# Patient Record
Sex: Female | Born: 1990 | Race: Black or African American | Hispanic: No | Marital: Single | Smoking: Current every day smoker
Health system: Southern US, Community
[De-identification: ages and names within clinical notes are randomized; demographics above are authoritative.]

## PROBLEM LIST (undated history)

## (undated) DIAGNOSIS — Z789 Other specified health status: Secondary | ICD-10-CM

## (undated) HISTORY — DX: Other specified health status: Z78.9

## (undated) HISTORY — PX: TONSILLECTOMY: SUR1361

---
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
## 2011-02-07 ENCOUNTER — Emergency Department: Payer: Self-pay | Admitting: *Deleted

## 2011-07-31 ENCOUNTER — Emergency Department: Payer: Self-pay | Admitting: Emergency Medicine

## 2011-07-31 LAB — PREGNANCY, URINE: Pregnancy Test, Urine: NEGATIVE m[IU]/mL

## 2011-07-31 LAB — URINALYSIS, COMPLETE
Bilirubin,UR: NEGATIVE
Ketone: NEGATIVE
Ph: 6 (ref 4.5–8.0)
Protein: NEGATIVE
RBC,UR: <1 /HPF (ref 0–5)
Squamous Epithelial: 10

## 2011-07-31 LAB — COMPREHENSIVE METABOLIC PANEL
Albumin: 3.8 g/dL (ref 3.4–5.0)
Alkaline Phosphatase: 86 U/L (ref 50–136)
Anion Gap: 7 (ref 7–16)
BUN: 9 mg/dL (ref 7–18)
Bilirubin,Total: 0.3 mg/dL (ref 0.2–1.0)
Calcium, Total: 9 mg/dL (ref 8.5–10.1)
Chloride: 107 mmol/L (ref 98–107)
EGFR (African American): >60
EGFR (Non-African Amer.): >60
Osmolality: 278 (ref 275–301)
Potassium: 4.5 mmol/L (ref 3.5–5.1)
Total Protein: 7.7 g/dL (ref 6.4–8.2)

## 2011-07-31 LAB — CBC
HCT: 41.3 % (ref 35.0–47.0)
MCHC: 33.2 g/dL (ref 32.0–36.0)
MCV: 95 fL (ref 80–100)
RBC: 4.34 10*6/uL (ref 3.80–5.20)
RDW: 13.8 % (ref 11.5–14.5)
WBC: 4.1 10*3/uL (ref 3.6–11.0)

## 2011-08-01 LAB — URINE CULTURE

## 2011-12-24 ENCOUNTER — Ambulatory Visit: Payer: Self-pay | Admitting: Family Medicine

## 2012-01-23 ENCOUNTER — Encounter: Payer: Self-pay | Admitting: Orthopedic Surgery

## 2012-01-31 ENCOUNTER — Encounter: Payer: Self-pay | Admitting: Orthopedic Surgery

## 2012-03-01 ENCOUNTER — Encounter: Payer: Self-pay | Admitting: Orthopedic Surgery

## 2012-04-01 ENCOUNTER — Encounter: Payer: Self-pay | Admitting: Orthopedic Surgery

## 2013-03-23 ENCOUNTER — Emergency Department: Payer: Self-pay | Admitting: Internal Medicine

## 2013-03-23 LAB — URINALYSIS, COMPLETE
Bilirubin,UR: NEGATIVE
Hyaline Cast: 2
Nitrite: POSITIVE
Ph: 7 (ref 4.5–8.0)
Protein: NEGATIVE
RBC,UR: 1 /HPF (ref 0–5)
Squamous Epithelial: 3
WBC UR: 3 /HPF (ref 0–5)

## 2013-03-25 LAB — URINE CULTURE

## 2013-06-04 ENCOUNTER — Emergency Department: Payer: Self-pay | Admitting: Emergency Medicine

## 2013-06-04 LAB — PREGNANCY, URINE: PREGNANCY TEST, URINE: NEGATIVE m[IU]/mL

## 2013-06-04 LAB — COMPREHENSIVE METABOLIC PANEL
ALBUMIN: 3.8 g/dL (ref 3.4–5.0)
ALT: 28 U/L (ref 12–78)
Alkaline Phosphatase: 73 U/L
Anion Gap: 3 — ABNORMAL LOW (ref 7–16)
BILIRUBIN TOTAL: 0.4 mg/dL (ref 0.2–1.0)
BUN: 12 mg/dL (ref 7–18)
CALCIUM: 8.6 mg/dL (ref 8.5–10.1)
CREATININE: 0.68 mg/dL (ref 0.60–1.30)
Chloride: 109 mmol/L — ABNORMAL HIGH (ref 98–107)
Co2: 28 mmol/L (ref 21–32)
EGFR (Non-African Amer.): >60
GLUCOSE: 108 mg/dL — AB (ref 65–99)
OSMOLALITY: 280 (ref 275–301)
Potassium: 3.9 mmol/L (ref 3.5–5.1)
SGOT(AST): 25 U/L (ref 15–37)
Sodium: 140 mmol/L (ref 136–145)
Total Protein: 6.9 g/dL (ref 6.4–8.2)

## 2013-06-04 LAB — URINALYSIS, COMPLETE
Bilirubin,UR: NEGATIVE
Blood: NEGATIVE
GLUCOSE, UR: NEGATIVE mg/dL (ref 0–75)
Ketone: NEGATIVE
NITRITE: NEGATIVE
Ph: 6 (ref 4.5–8.0)
Specific Gravity: 1.024 (ref 1.003–1.030)
Squamous Epithelial: 10

## 2013-06-04 LAB — CBC WITH DIFFERENTIAL/PLATELET
BASOS ABS: 0 10*3/uL (ref 0.0–0.1)
BASOS PCT: 0.4 %
Eosinophil #: 0 10*3/uL (ref 0.0–0.7)
Eosinophil %: 0.1 %
HCT: 40.9 % (ref 35.0–47.0)
HGB: 13.8 g/dL (ref 12.0–16.0)
LYMPHS PCT: 17.9 %
Lymphocyte #: 1.1 10*3/uL (ref 1.0–3.6)
MCH: 33.5 pg (ref 26.0–34.0)
MCHC: 33.7 g/dL (ref 32.0–36.0)
MCV: 100 fL (ref 80–100)
MONOS PCT: 5.9 %
Monocyte #: 0.4 x10 3/mm (ref 0.2–0.9)
NEUTROS ABS: 4.6 10*3/uL (ref 1.4–6.5)
Neutrophil %: 75.7 %
Platelet: 123 10*3/uL — ABNORMAL LOW (ref 150–440)
RBC: 4.11 10*6/uL (ref 3.80–5.20)
RDW: 13.7 % (ref 11.5–14.5)
WBC: 6.1 10*3/uL (ref 3.6–11.0)

## 2013-06-04 LAB — LIPASE, BLOOD: Lipase: 141 U/L (ref 73–393)

## 2014-10-22 ENCOUNTER — Emergency Department: Payer: Self-pay

## 2014-10-22 ENCOUNTER — Emergency Department
Admission: EM | Admit: 2014-10-22 | Discharge: 2014-10-22 | Disposition: A | Discharge status: Home / Self Care | Payer: Self-pay | Attending: Emergency Medicine | Admitting: Emergency Medicine

## 2014-10-22 DIAGNOSIS — K2970 Gastritis, unspecified, without bleeding: Secondary | ICD-10-CM | POA: Insufficient documentation

## 2014-10-22 DIAGNOSIS — Z3202 Encounter for pregnancy test, result negative: Secondary | ICD-10-CM | POA: Insufficient documentation

## 2014-10-22 DIAGNOSIS — R110 Nausea: Secondary | ICD-10-CM

## 2014-10-22 DIAGNOSIS — Z79899 Other long term (current) drug therapy: Secondary | ICD-10-CM | POA: Insufficient documentation

## 2014-10-22 DIAGNOSIS — R52 Pain, unspecified: Secondary | ICD-10-CM

## 2014-10-22 LAB — URINALYSIS COMPLETE WITH MICROSCOPIC (ARMC ONLY)
BACTERIA UA: NONE SEEN
BILIRUBIN URINE: NEGATIVE
Glucose, UA: NEGATIVE mg/dL
Hgb urine dipstick: NEGATIVE
KETONES UR: NEGATIVE mg/dL
Nitrite: NEGATIVE
Protein, ur: 100 mg/dL — AB
RBC / HPF: NONE SEEN RBC/hpf (ref 0–5)
SPECIFIC GRAVITY, URINE: 1.026 (ref 1.005–1.030)
Squamous Epithelial / LPF: NONE SEEN
WBC UA: NONE SEEN WBC/hpf (ref 0–5)
pH: 6 (ref 5.0–8.0)

## 2014-10-22 LAB — COMPREHENSIVE METABOLIC PANEL
ALK PHOS: 59 U/L (ref 38–126)
ALT: 19 U/L (ref 14–54)
ANION GAP: 6 (ref 5–15)
AST: 19 U/L (ref 15–41)
Albumin: 4.2 g/dL (ref 3.5–5.0)
BILIRUBIN TOTAL: 0.4 mg/dL (ref 0.3–1.2)
BUN: 13 mg/dL (ref 6–20)
CO2: 25 mmol/L (ref 22–32)
CREATININE: 0.81 mg/dL (ref 0.44–1.00)
Calcium: 9.1 mg/dL (ref 8.9–10.3)
Chloride: 108 mmol/L (ref 101–111)
GFR calc Af Amer: >60 mL/min (ref >60)
GFR calc non Af Amer: >60 mL/min (ref >60)
GLUCOSE: 92 mg/dL (ref 65–99)
POTASSIUM: 3.7 mmol/L (ref 3.5–5.1)
Sodium: 139 mmol/L (ref 135–145)
Total Protein: 7.1 g/dL (ref 6.5–8.1)

## 2014-10-22 LAB — CBC
HCT: 43.3 % (ref 35.0–47.0)
HEMOGLOBIN: 14.5 g/dL (ref 12.0–16.0)
MCH: 32.8 pg (ref 26.0–34.0)
MCHC: 33.4 g/dL (ref 32.0–36.0)
MCV: 98.4 fL (ref 80.0–100.0)
PLATELETS: 144 10*3/uL — AB (ref 150–440)
RBC: 4.41 MIL/uL (ref 3.80–5.20)
RDW: 13.1 % (ref 11.5–14.5)
WBC: 4.8 10*3/uL (ref 3.6–11.0)

## 2014-10-22 LAB — POCT PREGNANCY, URINE: Preg Test, Ur: NEGATIVE

## 2014-10-22 LAB — LIPASE, BLOOD: Lipase: 42 U/L (ref 22–51)

## 2014-10-22 MED ORDER — METOCLOPRAMIDE HCL 10 MG PO TABS
10.0000 mg | ORAL_TABLET | Freq: Once | ORAL | Status: AC
  Administered 2014-10-22: 10 mg via ORAL
  Filled 2014-10-22 (×2): qty 1

## 2014-10-22 MED ORDER — SUCRALFATE 1 G PO TABS: 1.0000 g | ORAL_TABLET | Freq: Four times a day (QID) | ORAL | Status: DC

## 2014-10-22 MED ORDER — ONDANSETRON 4 MG PO TBDP
4.0000 mg | ORAL_TABLET | Freq: Once | ORAL | Status: AC | PRN
  Administered 2014-10-22: 4 mg via ORAL

## 2014-10-22 MED ORDER — METOCLOPRAMIDE HCL 10 MG PO TABS: 10.0000 mg | ORAL_TABLET | Freq: Three times a day (TID) | ORAL | Status: DC

## 2014-10-22 NOTE — ED Notes (Signed)
Pt to ED c/o persistent nausea x 1 month.

## 2014-10-22 NOTE — ED Notes (Signed)
Patient with complaint of nausea and upper abd pressure times one month.

## 2014-10-22 NOTE — ED Provider Notes (Signed)
[facility] Emergency Department Provider Note  ____________________________________________  Time seen: Approximately 632 AM  I have reviewed the triage vital signs and the nursing notes.   HISTORY  Chief Complaint Nausea    HPI Loretta Warner is a 24 y.o. female who has had nausea for one month. The patient reports that she's had one episode of emesis a few days ago and one black stool 2 days ago. The patient reports that she took Pepto-Bismol approximately 3 days ago for the nauseous feeling. The patient reports to also having occasional abdominal pain and pressure. She reports that her last bowel movement was approximately 2 days ago. She does not regularly have bowel movements. She reports that the pain is worse when his pressure on itbut does not describe that she has any pain at this time. The patient reports that she's just unsure why she continually feels sick. She reports that it doesn't matter if she eats or does not eat she feels nauseous all of the time.   No past medical history on file.  There are no active problems to display for this patient.   Past surgical history Tonsillectomy  Current Outpatient Rx  Name  Route  Sig  Dispense  Refill  . Multiple Vitamin (MULTIVITAMIN) capsule   Oral   Take 1 capsule by mouth daily.         . metoCLOPramide (REGLAN) 10 MG tablet   Oral   Take 1 tablet (10 mg total) by mouth 3 (three) times daily with meals.   21 tablet   0   . sucralfate (CARAFATE) 1 G tablet   Oral   Take 1 tablet (1 g total) by mouth 4 (four) times daily.   30 tablet   0     Allergies Macadamia nut oil  No family history on file.  Social History History  Substance Use Topics  . Smoking status: Smoker  . Smokeless tobacco: Not on file  . Alcohol Use: Not on file    Review of Systems Constitutional: No fever/chills Eyes: No visual changes. ENT: No sore throat. Cardiovascular: Denies chest  pain. Respiratory: Denies shortness of breath. Gastrointestinal: Abdominal pain, nausea, vomiting Genitourinary: Negative for dysuria. Musculoskeletal: Negative for back pain. Skin: Negative for rash. Neurological: Negative for headaches, focal weakness or numbness. 10-point ROS otherwise negative.  ____________________________________________   PHYSICAL EXAM:  VITAL SIGNS: ED Triage Vitals  Enc Vitals Group     BP 10/22/14 0040 121/78 mmHg     Pulse Rate 10/22/14 0040 86     Resp --      Temp 10/22/14 0040 98.4 F (36.9 C)     Temp Source 10/22/14 0040 Oral     SpO2 10/22/14 0040 100 %     Weight 10/22/14 0040 189 lb (85.73 kg)     Height 10/22/14 0040  (1.626 m)     Head Cir --      Peak Flow --      Pain Score 10/22/14 0044 0     Pain Loc --      Pain Edu? --      Excl. in GC? --     Constitutional: Alert and oriented. Well appearing and in no acute distress. Eyes: Conjunctivae are normal. PERRL. EOMI. Head: Atraumatic. Nose: No congestion/rhinnorhea. Mouth/Throat: Mucous membranes are moist.  Oropharynx non-erythematous. Cardiovascular: Normal rate, regular rhythm. Grossly normal heart sounds.  Good peripheral circulation. Respiratory: Normal respiratory effort.  No retractions. Lungs CTAB. Gastrointestinal: Soft,  epigastric and RUQ TTP. No distention. Positive bowel sounds  Genitourinary: deferred Rectal: Brown stool, heme negative Musculoskeletal: No lower extremity tenderness nor edema.   Neurologic:  Normal speech and language. No gross focal neurologic deficits are appreciated.  Skin:  Skin is warm, dry and intact.  Psychiatric: Mood and affect are normal.   ____________________________________________   LABS (all labs ordered are listed, but only abnormal results are displayed)  Labs Reviewed  CBC - Abnormal; Notable for the following:    Platelets 144 (*)    All other components within normal limits  URINALYSIS COMPLETEWITH MICROSCOPIC ([facility]  ONLY) - Abnormal; Notable for the following:    Color, Urine YELLOW (*)    APPearance HAZY (*)    Protein, ur 100 (*)    Leukocytes, UA TRACE (*)    All other components within normal limits  LIPASE, BLOOD  COMPREHENSIVE METABOLIC PANEL  POC URINE PREG, ED  POCT PREGNANCY, URINE   ____________________________________________  EKG  none ____________________________________________  RADIOLOGY  RUQ Ultrasound: Negative for gallstones, normal exam ____________________________________________   PROCEDURES  Procedure(s) performed: None  Critical Care performed: No  ____________________________________________   INITIAL IMPRESSION / ASSESSMENT AND PLAN / ED COURSE  Pertinent labs & imaging results that were available during my care of the patient were reviewed by me and considered in my medical decision making (see chart for details).  The patient is a rolled female who comes in today with nausea for one month and occasional abdominal pain and vomiting. The patient does have some tenderness to palpation of her abdomen so I will do an ultrasound to evaluate for possible gallstones. The patient will also receive some Reglan for her nausea. She'll be reassessed once received the results of the ultrasound.  The patient received ultrasound and it was unremarkable. At this time I feel the patient should follow-up with GI physician for further evaluation of her symptoms. ____________________________________________   FINAL CLINICAL IMPRESSION(S) / ED DIAGNOSES  Final diagnoses:  Nausea  Pain  Gastritis      Rebecka Apley, MD 10/22/14 [phone]

## 2014-10-22 NOTE — Discharge Instructions (Signed)
Nausea, Adult Nausea is the feeling that you have an upset stomach or have to vomit. Nausea by itself is not likely a serious concern, but it may be an early sign of more serious medical problems. As nausea gets worse, it can lead to vomiting. If vomiting develops, there is the risk of dehydration.  CAUSES   Viral infections.  Food poisoning.  Medicines.  Pregnancy.  Motion sickness.  Migraine headaches.  Emotional distress.  Severe pain from any source.  Alcohol intoxication. HOME CARE INSTRUCTIONS  Get plenty of rest.  Ask your caregiver about specific rehydration instructions.  Eat small amounts of food and sip liquids more often.  Take all medicines as told by your caregiver. SEEK MEDICAL CARE IF:  You have not improved after 2 days, or you get worse.  You have a headache. SEEK IMMEDIATE MEDICAL CARE IF:   You have a fever.  You faint.  You keep vomiting or have blood in your vomit.  You are extremely weak or dehydrated.  You have dark or bloody stools.  You have severe chest or abdominal pain. MAKE SURE YOU:  Understand these instructions.  Will watch your condition.  Will get help right away if you are not doing well or get worse. Document Released: 04/25/2004 Document Revised: 12/11/2011 Document Reviewed: 11/28/2010 [facility] Patient Information 2015 [place], [place]. This information is not intended to replace advice given to you by your health care provider. Make sure you discuss any questions you have with your health care provider.  Gastritis, Adult Gastritis is soreness and swelling (inflammation) of the lining of the stomach. Gastritis can develop as a sudden onset (acute) or long-term (chronic) condition. If gastritis is not treated, it can lead to stomach bleeding and ulcers. CAUSES  Gastritis occurs when the stomach lining is weak or damaged. Digestive juices from the stomach then inflame the weakened stomach lining. The stomach lining may  be weak or damaged due to viral or bacterial infections. One common bacterial infection is the Helicobacter pylori infection. Gastritis can also result from excessive alcohol consumption, taking certain medicines, or having too much acid in the stomach.  SYMPTOMS  In some cases, there are no symptoms. When symptoms are present, they may include:  Pain or a burning sensation in the upper abdomen.  Nausea.  Vomiting.  An uncomfortable feeling of fullness after eating. DIAGNOSIS  Your caregiver may suspect you have gastritis based on your symptoms and a physical exam. To determine the cause of your gastritis, your caregiver may perform the following:  Blood or stool tests to check for the H pylori bacterium.  Gastroscopy. A thin, flexible tube (endoscope) is passed down the esophagus and into the stomach. The endoscope has a light and camera on the end. Your caregiver uses the endoscope to view the inside of the stomach.  Taking a tissue sample (biopsy) from the stomach to examine under a microscope. TREATMENT  Depending on the cause of your gastritis, medicines may be prescribed. If you have a bacterial infection, such as an H pylori infection, antibiotics may be given. If your gastritis is caused by too much acid in the stomach, H2 blockers or antacids may be given. Your caregiver may recommend that you stop taking aspirin, ibuprofen, or other nonsteroidal anti-inflammatory drugs (NSAIDs). HOME CARE INSTRUCTIONS  Only take over-the-counter or prescription medicines as directed by your caregiver.  If you were given antibiotic medicines, take them as directed. Finish them even if you start to feel better.  Drink  enough fluids to keep your urine clear or pale yellow.  Avoid foods and drinks that make your symptoms worse, such as:  Caffeine or alcoholic drinks.  Chocolate.  Peppermint or mint flavorings.  Garlic and onions.  Spicy foods.  Citrus fruits, such as oranges, lemons, or  limes.  Tomato-based foods such as sauce, chili, salsa, and pizza.  Fried and fatty foods.  Eat small, frequent meals instead of large meals. SEEK IMMEDIATE MEDICAL CARE IF:   You have black or dark red stools.  You vomit blood or material that looks like coffee grounds.  You are unable to keep fluids down.  Your abdominal pain gets worse.  You have a fever.  You do not feel better after 1 week.  You have any other questions or concerns. MAKE SURE YOU:  Understand these instructions.  Will watch your condition.  Will get help right away if you are not doing well or get worse. Document Released: 03/12/2001 Document Revised: 09/17/2011 Document Reviewed: 05/01/2011 [facility] Patient Information 2015 [place], [place]. This information is not intended to replace advice given to you by your health care provider. Make sure you discuss any questions you have with your health care provider.

## 2015-09-20 ENCOUNTER — Encounter: Payer: Self-pay | Admitting: Emergency Medicine

## 2015-09-20 ENCOUNTER — Emergency Department: Payer: No Typology Code available for payment source

## 2015-09-20 ENCOUNTER — Emergency Department
Admission: EM | Admit: 2015-09-20 | Discharge: 2015-09-20 | Disposition: A | Discharge status: Home / Self Care | Payer: No Typology Code available for payment source | Attending: Emergency Medicine | Admitting: Emergency Medicine

## 2015-09-20 DIAGNOSIS — Y999 Unspecified external cause status: Secondary | ICD-10-CM | POA: Insufficient documentation

## 2015-09-20 DIAGNOSIS — Y9389 Activity, other specified: Secondary | ICD-10-CM | POA: Diagnosis not present

## 2015-09-20 DIAGNOSIS — F17210 Nicotine dependence, cigarettes, uncomplicated: Secondary | ICD-10-CM | POA: Insufficient documentation

## 2015-09-20 DIAGNOSIS — Y92410 Unspecified street and highway as the place of occurrence of the external cause: Secondary | ICD-10-CM | POA: Diagnosis not present

## 2015-09-20 DIAGNOSIS — M25511 Pain in right shoulder: Secondary | ICD-10-CM | POA: Diagnosis present

## 2015-09-20 DIAGNOSIS — S134XXA Sprain of ligaments of cervical spine, initial encounter: Secondary | ICD-10-CM | POA: Diagnosis not present

## 2015-09-20 MED ORDER — CYCLOBENZAPRINE HCL 10 MG PO TABS
10.0000 mg | ORAL_TABLET | Freq: Once | ORAL | Status: AC
  Administered 2015-09-20: 10 mg via ORAL
  Filled 2015-09-20: qty 1

## 2015-09-20 MED ORDER — CYCLOBENZAPRINE HCL 10 MG PO TABS: 10.0000 mg | ORAL_TABLET | Freq: Three times a day (TID) | ORAL | Status: DC | PRN

## 2015-09-20 MED ORDER — LIDOCAINE VISCOUS 2 % MT SOLN
15.0000 mL | Freq: Once | OROMUCOSAL | Status: AC
  Administered 2015-09-20: 15 mL via OROMUCOSAL
  Filled 2015-09-20: qty 15

## 2015-09-20 NOTE — ED Notes (Signed)
Pt discharged to home.  Family member driving.  Discharge instructions reviewed.  Verbalized understanding.  No questions or concerns at this time.  Teach back verified.  Pt in NAD.  No items left in ED.   
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
## 2016-02-13 ENCOUNTER — Emergency Department: Payer: Self-pay

## 2016-02-13 ENCOUNTER — Emergency Department
Admission: EM | Admit: 2016-02-13 | Discharge: 2016-02-13 | Disposition: A | Discharge status: Home / Self Care | Payer: Self-pay | Attending: Emergency Medicine | Admitting: Emergency Medicine

## 2016-02-13 DIAGNOSIS — N83292 Other ovarian cyst, left side: Secondary | ICD-10-CM | POA: Insufficient documentation

## 2016-02-13 DIAGNOSIS — Z79899 Other long term (current) drug therapy: Secondary | ICD-10-CM | POA: Insufficient documentation

## 2016-02-13 DIAGNOSIS — F17210 Nicotine dependence, cigarettes, uncomplicated: Secondary | ICD-10-CM | POA: Insufficient documentation

## 2016-02-13 DIAGNOSIS — N83299 Other ovarian cyst, unspecified side: Secondary | ICD-10-CM

## 2016-02-13 DIAGNOSIS — R102 Pelvic and perineal pain: Secondary | ICD-10-CM

## 2016-02-13 LAB — WET PREP, GENITAL
Clue Cells Wet Prep HPF POC: NONE SEEN
Sperm: NONE SEEN
Trich, Wet Prep: NONE SEEN
Yeast Wet Prep HPF POC: NONE SEEN

## 2016-02-13 LAB — CHLAMYDIA/NGC RT PCR (ARMC ONLY)
CHLAMYDIA TR: NOT DETECTED
N GONORRHOEAE: NOT DETECTED

## 2016-02-13 LAB — COMPREHENSIVE METABOLIC PANEL
ALT: 12 U/L — ABNORMAL LOW (ref 14–54)
AST: 17 U/L (ref 15–41)
Albumin: 4.1 g/dL (ref 3.5–5.0)
Alkaline Phosphatase: 50 U/L (ref 38–126)
Anion gap: 5 (ref 5–15)
BILIRUBIN TOTAL: 0.7 mg/dL (ref 0.3–1.2)
BUN: 11 mg/dL (ref 6–20)
CO2: 28 mmol/L (ref 22–32)
Calcium: 9.3 mg/dL (ref 8.9–10.3)
Chloride: 107 mmol/L (ref 101–111)
Creatinine, Ser: 0.74 mg/dL (ref 0.44–1.00)
Glucose, Bld: 97 mg/dL (ref 65–99)
POTASSIUM: 4 mmol/L (ref 3.5–5.1)
Sodium: 140 mmol/L (ref 135–145)
TOTAL PROTEIN: 7 g/dL (ref 6.5–8.1)

## 2016-02-13 LAB — URINALYSIS COMPLETE WITH MICROSCOPIC (ARMC ONLY)
Bacteria, UA: NONE SEEN
Bilirubin Urine: NEGATIVE
GLUCOSE, UA: NEGATIVE mg/dL
Ketones, ur: NEGATIVE mg/dL
LEUKOCYTES UA: NEGATIVE
NITRITE: NEGATIVE
Protein, ur: 30 mg/dL — AB
SPECIFIC GRAVITY, URINE: 1.023 (ref 1.005–1.030)
pH: 6 (ref 5.0–8.0)

## 2016-02-13 LAB — CBC
HEMATOCRIT: 43.4 % (ref 35.0–47.0)
Hemoglobin: 14.8 g/dL (ref 12.0–16.0)
MCH: 33.9 pg (ref 26.0–34.0)
MCHC: 34.2 g/dL (ref 32.0–36.0)
MCV: 99 fL (ref 80.0–100.0)
Platelets: 122 10*3/uL — ABNORMAL LOW (ref 150–440)
RBC: 4.38 MIL/uL (ref 3.80–5.20)
RDW: 13.7 % (ref 11.5–14.5)
WBC: 3.6 10*3/uL (ref 3.6–11.0)

## 2016-02-13 LAB — LIPASE, BLOOD: Lipase: 28 U/L (ref 11–51)

## 2016-02-13 LAB — POCT PREGNANCY, URINE: Preg Test, Ur: NEGATIVE

## 2016-02-13 MED ORDER — OXYCODONE-ACETAMINOPHEN 5-325 MG PO TABS
1.0000 | ORAL_TABLET | ORAL | Status: DC | PRN
  Administered 2016-02-13: 1 via ORAL
  Filled 2016-02-13: qty 1

## 2016-02-13 MED ORDER — MELOXICAM 15 MG PO TABS: 15.0000 mg | ORAL_TABLET | Freq: Every day | ORAL | 2 refills | Status: AC

## 2016-02-13 NOTE — Discharge Instructions (Signed)
Please take medications as instructed. Follow up with OB/GYN doctor for further treatment with your ovarian cyst.

## 2016-02-13 NOTE — ED Triage Notes (Signed)
Pt c/o LLQ pain for the past couple of days.. Denies vomiting or diarrhea. Last BM today, normal

## 2016-02-13 NOTE — ED Notes (Signed)
Discharge instructions reviewed with patient. Patient verbalized understanding. Patient ambulated to lobby without difficulty.   

## 2016-02-13 NOTE — ED Notes (Signed)
Patient transported to Ultrasound 

## 2016-02-13 NOTE — ED Provider Notes (Signed)
[facility] Emergency Department Provider Note  ____________________________________________  Time seen: Approximately 4:49 PM  I have reviewed the triage vital signs and the nursing notes.   HISTORY  Chief Complaint Abdominal Pain    HPI Loretta Warner is a 25 y.o. female presents to the ED with LLQ intermittent, stabbing abdominal pain for 4 days. States she had her last Depo-provera shot in June of 2016 and her first period was in July 2017.  Ever since then her periods have been very irregular with heavy bleeding.  She experiences this abdominal pain with each period but each period the pain progressively gets worse. Her last period ended Nov 1 and then started again Nov 10. This period has been the heaviest she has ever experienced since starting in July as well as the LLQ pain being the worst with this period. Denies fever, vomiting, diarrhea, nausea, vaginal itching, or vaginal discharge. Admits to constipation and going "on average 1 time a week".  Her last bowel movement was today.  Admits to a family history of ovarian cysts. Has not had a pap smear or tested for STD's in more than three years, but admits to never having an abnormal pap smear.  She is sexually active and last had unprotected sex a month ago. States she took 3 ibuprofen 200mg  without relief.  States her pain is a 7.5/10 intensity.    History reviewed. No pertinent past medical history.  There are no active problems to display for this patient.   Past Surgical History:  Procedure Laterality Date  . TONSILLECTOMY      Prior to Admission medications   Medication Sig Start Date End Date Taking? Authorizing Provider  cyclobenzaprine (FLEXERIL) 10 MG tablet Take 1 tablet (10 mg total) by mouth 3 (three) times daily as needed for muscle spasms. 09/20/15   Darci Current, MD  metoCLOPramide (REGLAN) 10 MG tablet Take 1 tablet (10 mg total) by mouth 3 (three) times daily with meals.  10/22/14 10/22/15  Rebecka Apley, MD  Multiple Vitamin (MULTIVITAMIN) capsule Take 1 capsule by mouth daily.    Historical Provider, MD  sucralfate (CARAFATE) 1 G tablet Take 1 tablet (1 g total) by mouth 4 (four) times daily. 10/22/14 10/22/15  Rebecka Apley, MD    Allergies Macadamia nut oil  No family history on file.  Social History Social History  Substance Use Topics  . Smoking status: Current Some Day Smoker    Types: Cigarettes  . Smokeless tobacco: Never Used  . Alcohol use Yes     Review of Systems  Constitutional: No fever/chills Eyes: No visual changes. No discharge ENT: No upper respiratory complaints. Cardiovascular: no chest pain. Respiratory: no cough. No SOB. Gastrointestinal:   No nausea, no vomiting.  No diarrhea.  Positive for LLQ abdominal pain and constipation.  Genitourinary: Negative for dysuria. No hematuria. No vaginal itching or discharge.. Skin: Negative for rash, abrasions, lacerations, ecchymosis. Neurological: Negative for headaches, focal weakness or numbness. 10-point ROS otherwise negative.  ____________________________________________   PHYSICAL EXAM:  VITAL SIGNS: ED Triage Vitals [02/13/16 1358]  Enc Vitals Group     BP 111/69     Pulse Rate 96     Resp 16     Temp 98.4 F (36.9 C)     Temp Source Oral     SpO2 100 %     Weight 160 lb (72.6 kg)     Height 5\' 4"  (1.626 m)     Head  Circumference      Peak Flow      Pain Score 8     Pain Loc      Pain Edu?      Excl. in GC?      Constitutional: Alert and oriented. Well appearing and in no acute distress. Eyes: Conjunctivae are normal. PERRL. EOMI. Head: Atraumatic.  Cardiovascular: Normal rate, regular rhythm. No murmurs, rubs, or gallops. Normal S1 and S2.  Good peripheral circulation. Respiratory: Normal respiratory effort without tachypnea or retractions. Lungs CTAB. Good air entry to the bases with no decreased or absent breath sounds. Gastrointestinal: Bowel  sounds 4 quadrants. Soft Soft to palpation. Patient is tender to palpation to the left lower quadrant.. No guarding or rigidity. No palpable masses. No distention.  Genitourinary: no external lesions, chancres, or sores on genitalia. On speculum exam there is bloody discharge noted from period. No odor or abnormal vaginal discharge on speculum exam noted with no lesions on the vaginal wall. Cervical os is closed. No cervical motion tenderness. Cervix has no lesions or gross abnormalities noted. Tenderness to palpation on left adnexa. No masses noted on exam.  Neurologic:  Normal speech and language. No gross focal neurologic deficits are appreciated.  Skin:  Skin is warm, dry and intact. No rash noted. Psychiatric: Mood and affect are normal. Speech and behavior are normal. Patient exhibits appropriate insight and judgement.   ____________________________________________   LABS (all labs ordered are listed, but only abnormal results are displayed)  Labs Reviewed  COMPREHENSIVE METABOLIC PANEL - Abnormal; Notable for the following:       Result Value   ALT 12 (*)    All other components within normal limits  CBC - Abnormal; Notable for the following:    Platelets 122 (*)    All other components within normal limits  URINALYSIS COMPLETEWITH MICROSCOPIC ([facility] ONLY) - Abnormal; Notable for the following:    Color, Urine YELLOW (*)    APPearance CLEAR (*)    Hgb urine dipstick 3+ (*)    Protein, ur 30 (*)    Squamous Epithelial / LPF 0-5 (*)    All other components within normal limits  WET PREP, GENITAL  CHLAMYDIA/NGC RT PCR ([facility] ONLY)  LIPASE, BLOOD  POC URINE PREG, ED  POCT PREGNANCY, URINE   ____________________________________________  EKG   ____________________________________________  RADIOLOGY Festus BarrenI, Jonathan D Cuthriell, personally viewed and evaluated these images (plain radiographs) as part of my medical decision making, as well as reviewing the written report  by the radiologist.  [place] Transvaginal Non-ob  Result Date: 02/13/2016 CLINICAL DATA:  Left lower quadrant pain x3 days.  Irregular menses. EXAM: TRANSABDOMINAL AND TRANSVAGINAL ULTRASOUND OF PELVIS TECHNIQUE: Both transabdominal and transvaginal ultrasound examinations of the pelvis were performed. Transabdominal technique was performed for global imaging of the pelvis including uterus, ovaries, adnexal regions, and pelvic cul-de-sac. It was necessary to proceed with endovaginal exam following the transabdominal exam to visualize the endometrium. COMPARISON:  08/07/2006 CT FINDINGS: Uterus Measurements: 7.7 x 4.6 x 5.6 cm. The uterus is anteverted. No fibroids or other mass visualized. Endometrium Thickness: 3.8 mm.  No focal abnormality visualized. Right ovary Measurements: 4.1 x 3.4 x 3.3 cm with 2.9 x 2.2 x 2.6 cm follicle noted. Normal appearance/no adnexal mass. Left ovary Measurements: 4.3 x 2.7 x 3.2 cm with a 3.1 x 2.2 x 3 cm complex cyst containing 2-3 internal daughter cysts measuring up to 4 mm seen within. Other findings No abnormal free fluid. IMPRESSION: Complex  left ovarian cyst measuring 3.1 x 2.2 x 3 cm with internal daughter cysts/ thin septations. Findings are likely benign but recommend 6-12 week follow-up to ensure resolution. Electronically Signed   By: Tollie Ethavid  Kwon M.D.   On: 02/13/2016 18:35   [place] Pelvis Complete  Result Date: 02/13/2016 CLINICAL DATA:  Left lower quadrant pain x3 days.  Irregular menses. EXAM: TRANSABDOMINAL AND TRANSVAGINAL ULTRASOUND OF PELVIS TECHNIQUE: Both transabdominal and transvaginal ultrasound examinations of the pelvis were performed. Transabdominal technique was performed for global imaging of the pelvis including uterus, ovaries, adnexal regions, and pelvic cul-de-sac. It was necessary to proceed with endovaginal exam following the transabdominal exam to visualize the endometrium. COMPARISON:  08/07/2006 CT FINDINGS: Uterus Measurements: 7.7 x 4.6 x 5.6  cm. The uterus is anteverted. No fibroids or other mass visualized. Endometrium Thickness: 3.8 mm.  No focal abnormality visualized. Right ovary Measurements: 4.1 x 3.4 x 3.3 cm with 2.9 x 2.2 x 2.6 cm follicle noted. Normal appearance/no adnexal mass. Left ovary Measurements: 4.3 x 2.7 x 3.2 cm with a 3.1 x 2.2 x 3 cm complex cyst containing 2-3 internal daughter cysts measuring up to 4 mm seen within. Other findings No abnormal free fluid. IMPRESSION: Complex left ovarian cyst measuring 3.1 x 2.2 x 3 cm with internal daughter cysts/ thin septations. Findings are likely benign but recommend 6-12 week follow-up to ensure resolution. Electronically Signed   By: Tollie Ethavid  Kwon M.D.   On: 02/13/2016 18:35    ____________________________________________    PROCEDURES  Procedure(s) performed:    Procedures    Medications  oxyCODONE-acetaminophen (PERCOCET/ROXICET) 5-325 MG per tablet 1 tablet (1 tablet Oral Given 02/13/16 1424)     ____________________________________________   INITIAL IMPRESSION / ASSESSMENT AND PLAN / ED COURSE  Pertinent labs & imaging results that were available during my care of the patient were reviewed by me and considered in my medical decision making (see chart for details).  Review of the  [place] was performed in accordance of the [facility] prior to dispensing any controlled drugs.  Clinical Course     Patient's diagnosis is consistent with left adnexal cyst. Ultrasound found a complex cyst on the left ovary.Labs returned with reassuring results. Gonorrhea and chlamydia had not returned upon discharge but it is felt at this time that diagnosis of this complaint is unlikely. She does return positive results, we will call patient and prescribe medications for same. Patient will be discharged home with instructions to take meloxicam, heating pad, or pamprin for pain. Patient is to follow up with OB/GYN for further evaluation and treatment of this condition.. Patient is  given ED precautions to return to the ED for any worsening or new symptoms.     ____________________________________________  FINAL CLINICAL IMPRESSION(S) / ED DIAGNOSES  Final diagnoses:  Pelvic pain      NEW MEDICATIONS STARTED DURING THIS VISIT:  New Prescriptions   No medications on file        This chart was dictated using voice recognition software/Dragon. Despite best efforts to proofread, errors can occur which can change the meaning. Any change was purely unintentional.   Racheal PatchesJonathan D Cuthriell, PA-C 02/13/16 2020    Minna AntisKevin Paduchowski, MD 02/13/16 2248

## 2016-06-14 ENCOUNTER — Emergency Department
Admission: EM | Admit: 2016-06-14 | Discharge: 2016-06-14 | Disposition: A | Discharge status: Home / Self Care | Payer: Medicaid Other | Attending: Emergency Medicine | Admitting: Emergency Medicine

## 2016-06-14 ENCOUNTER — Encounter: Payer: Self-pay | Admitting: Emergency Medicine

## 2016-06-14 DIAGNOSIS — F17210 Nicotine dependence, cigarettes, uncomplicated: Secondary | ICD-10-CM | POA: Insufficient documentation

## 2016-06-14 DIAGNOSIS — Z79899 Other long term (current) drug therapy: Secondary | ICD-10-CM | POA: Insufficient documentation

## 2016-06-14 DIAGNOSIS — J111 Influenza due to unidentified influenza virus with other respiratory manifestations: Secondary | ICD-10-CM | POA: Insufficient documentation

## 2016-06-14 MED ORDER — BENZONATATE 100 MG PO CAPS: 100.0000 mg | ORAL_CAPSULE | Freq: Three times a day (TID) | ORAL | 0 refills | Status: DC | PRN

## 2016-06-14 MED ORDER — IBUPROFEN 800 MG PO TABS
800.0000 mg | ORAL_TABLET | Freq: Once | ORAL | Status: AC
  Administered 2016-06-14: 800 mg via ORAL

## 2016-06-14 MED ORDER — FLUTICASONE PROPIONATE 50 MCG/ACT NA SUSP: 2.0000 | Freq: Every day | NASAL | 0 refills | Status: DC

## 2016-06-14 MED ORDER — ACETAMINOPHEN 500 MG PO TABS
1000.0000 mg | ORAL_TABLET | Freq: Once | ORAL | Status: AC
  Administered 2016-06-14: 1000 mg via ORAL
  Filled 2016-06-14: qty 2

## 2016-06-14 MED ORDER — OSELTAMIVIR PHOSPHATE 75 MG PO CAPS: 75.0000 mg | ORAL_CAPSULE | Freq: Two times a day (BID) | ORAL | 0 refills | Status: AC

## 2016-06-14 MED ORDER — ACETAMINOPHEN-CODEINE #3 300-30 MG PO TABS: 1.0000 | ORAL_TABLET | Freq: Three times a day (TID) | ORAL | 0 refills | Status: DC | PRN

## 2016-06-14 MED ORDER — IBUPROFEN 800 MG PO TABS
ORAL_TABLET | ORAL | Status: AC
  Administered 2016-06-14: 800 mg via ORAL
  Filled 2016-06-14: qty 1

## 2016-06-14 NOTE — ED Provider Notes (Signed)
[name] Emergency Department Provider Note ____________________________________________  Time seen: 1842  I have reviewed the triage vital signs and the nursing notes.  HISTORY  Chief Complaint  Influenza   HPI Loretta Warner is a 26 y.o. female presents to the ED for evaluation of 3 days complaint of cough and body aches, with a 2 day complaint of fever onset. The patient works in a sitting facility, but is not aware of any direct flu contacts. She did not receive the seasonal flu vaccine. She denies any nausea, vomiting, or dizziness.  History reviewed. No pertinent past medical history.  There are no active problems to display for this patient.   Past Surgical History:  Procedure Laterality Date  . TONSILLECTOMY      Prior to Admission medications   Medication Sig Start Date End Date Taking? Authorizing Provider  acetaminophen-codeine (TYLENOL #3) 300-30 MG tablet Take 1 tablet by mouth every 8 (eight) hours as needed for moderate pain. 06/14/16   Franziska Podgurski V Bacon Keosha Rossa, PA-C  benzonatate (TESSALON PERLES) 100 MG capsule Take 1 capsule (100 mg total) by mouth 3 (three) times daily as needed for cough (Take 1-2 per dose). 06/14/16   Faisal Stradling V Bacon Jerrell Mangel, PA-C  cyclobenzaprine (FLEXERIL) 10 MG tablet Take 1 tablet (10 mg total) by mouth 3 (three) times daily as needed for muscle spasms. 09/20/15   Darci Current, MD  fluticasone (FLONASE) 50 MCG/ACT nasal spray Place 2 sprays into both nostrils daily. 06/14/16   Jailyn Langhorst V Bacon Shourya Macpherson, PA-C  meloxicam (MOBIC) 15 MG tablet Take 1 tablet (15 mg total) by mouth daily. 02/13/16 02/12/17  Christiane Ha D Cuthriell, PA-C  metoCLOPramide (REGLAN) 10 MG tablet Take 1 tablet (10 mg total) by mouth 3 (three) times daily with meals. 10/22/14 10/22/15  Rebecka Apley, MD  Multiple Vitamin (MULTIVITAMIN) capsule Take 1 capsule by mouth daily.    Historical Provider, MD  oseltamivir (TAMIFLU) 75 MG capsule Take 1  capsule (75 mg total) by mouth 2 (two) times daily. 06/14/16 06/19/16  Eavan Gonterman V Bacon Jaymir Struble, PA-C  sucralfate (CARAFATE) 1 G tablet Take 1 tablet (1 g total) by mouth 4 (four) times daily. 10/22/14 10/22/15  Rebecka Apley, MD    Allergies Macadamia nut oil  No family history on file.  Social History Social History  Substance Use Topics  . Smoking status: Current Some Day Smoker    Types: Cigarettes  . Smokeless tobacco: Never Used  . Alcohol use Yes    Review of Systems  Constitutional: Positive for fever. Eyes: Negative for visual changes. ENT: Negative for sore throat. Cardiovascular: Negative for chest pain. Respiratory: Negative for shortness of breath. Reports cough. Gastrointestinal: Negative for abdominal pain, vomiting and diarrhea. Genitourinary: Negative for dysuria. Musculoskeletal: Negative for back pain. Reports generalized bodyaches. Skin: Negative for rash. Neurological: Negative for headaches, focal weakness or numbness. ____________________________________________  PHYSICAL EXAM:  VITAL SIGNS: ED Triage Vitals  Enc Vitals Group     BP 06/14/16 1839 123/82     Pulse Rate 06/14/16 1839 (!) 113     Resp 06/14/16 1839 20     Temp 06/14/16 1839 (!) 103 F (39.4 C)     Temp Source 06/14/16 1839 Oral     SpO2 06/14/16 1839 98 %     Weight 06/14/16 1840 160 lb (72.6 kg)     Height 06/14/16 1840 5\' 5"  (1.651 m)     Head Circumference --      Peak Flow --  Pain Score 06/14/16 1842 7     Pain Loc --      Pain Edu? --      Excl. in GC? --     Constitutional: Alert and oriented. Well appearing and in no distress. Head: Normocephalic and atraumatic. Eyes: Conjunctivae are normal. PERRL. Normal extraocular movements Ears: Canals clear. TMs intact bilaterally. Nose: No congestion/rhinorrhea/epistaxis. Mouth/Throat: Mucous membranes are moist. Neck: Supple. No thyromegaly. Hematological/Lymphatic/Immunological: No cervical  lymphadenopathy. Cardiovascular: Normal rate, regular rhythm. Normal distal pulses. Respiratory: Normal respiratory effort. No wheezes/rales/rhonchi. Gastrointestinal: Soft and nontender. No distention. Musculoskeletal: Nontender with normal range of motion in all extremities.  Neurologic:  Normal gait without ataxia. Normal speech and language. No gross focal neurologic deficits are appreciated. Skin:  Skin is warm, dry and intact. No rash noted. ____________________________________________  PROCEDURES  Tylenol 1000 mg PO IBU 800 mg PO ____________________________________________  INITIAL IMPRESSION / ASSESSMENT AND PLAN / ED COURSE  Patient with a clinical presentation consistent with influenza. She is discharged with the diagnosis of influenza without laboratory confirmation. She is discharged at this time with a prescription for Tamiflu, #3, Flonase, and Tessalon Perles. She will follow with the primary care provider or return to the ED for acutely worsening symptoms. Work note is provided for 3 days as is appropriate. ____________________________________________  FINAL CLINICAL IMPRESSION(S) / ED DIAGNOSES  Final diagnoses:  Influenza      Lissa HoardJenise V Bacon Ayvion Kavanagh, PA-C 06/14/16 2332    Sharyn CreamerMark Quale, MD 06/14/16 2348

## 2016-06-14 NOTE — ED Notes (Signed)
Attempted to call back to room. No answer.

## 2016-06-14 NOTE — ED Triage Notes (Signed)
States she developed fever cough and body aches 3 days ago

## 2016-06-14 NOTE — Discharge Instructions (Signed)
Your symptoms are consistent with influenza. Take ibuprofen and Tylenol #3 as needed for fevers and bodyaches.You may also take the Tamiflu as directed to potentially shorten your flu symptoms. Drink and rest to prevent dehydration. [facility].

## 2016-06-14 NOTE — ED Triage Notes (Signed)
Pt with flu like sx.  

## 2016-10-05 ENCOUNTER — Emergency Department
Admission: EM | Admit: 2016-10-05 | Discharge: 2016-10-05 | Disposition: A | Discharge status: Home / Self Care | Payer: Medicaid Other | Attending: Emergency Medicine | Admitting: Emergency Medicine

## 2016-10-05 DIAGNOSIS — Z791 Long term (current) use of non-steroidal anti-inflammatories (NSAID): Secondary | ICD-10-CM | POA: Insufficient documentation

## 2016-10-05 DIAGNOSIS — N938 Other specified abnormal uterine and vaginal bleeding: Secondary | ICD-10-CM

## 2016-10-05 DIAGNOSIS — F17210 Nicotine dependence, cigarettes, uncomplicated: Secondary | ICD-10-CM | POA: Insufficient documentation

## 2016-10-05 DIAGNOSIS — Z79899 Other long term (current) drug therapy: Secondary | ICD-10-CM | POA: Insufficient documentation

## 2016-10-05 DIAGNOSIS — R1032 Left lower quadrant pain: Secondary | ICD-10-CM

## 2016-10-05 LAB — CBC WITH DIFFERENTIAL/PLATELET
BASOS ABS: 0 10*3/uL (ref 0–0.1)
BASOS PCT: 1 %
Eosinophils Absolute: 0.1 10*3/uL (ref 0–0.7)
Eosinophils Relative: 2 %
HEMATOCRIT: 43.4 % (ref 35.0–47.0)
Hemoglobin: 14.9 g/dL (ref 12.0–16.0)
Lymphocytes Relative: 35 %
Lymphs Abs: 1.7 10*3/uL (ref 1.0–3.6)
MCH: 34.3 pg — ABNORMAL HIGH (ref 26.0–34.0)
MCHC: 34.3 g/dL (ref 32.0–36.0)
MCV: 100.1 fL — ABNORMAL HIGH (ref 80.0–100.0)
MONO ABS: 0.4 10*3/uL (ref 0.2–0.9)
Monocytes Relative: 8 %
NEUTROS ABS: 2.7 10*3/uL (ref 1.4–6.5)
Neutrophils Relative %: 54 %
Platelets: 139 10*3/uL — ABNORMAL LOW (ref 150–440)
RBC: 4.33 MIL/uL (ref 3.80–5.20)
RDW: 13.8 % (ref 11.5–14.5)
WBC: 4.9 10*3/uL (ref 3.6–11.0)

## 2016-10-05 LAB — POCT PREGNANCY, URINE: Preg Test, Ur: NEGATIVE

## 2016-10-05 MED ORDER — HYDROCODONE-ACETAMINOPHEN 5-325 MG PO TABS: 1.0000 | ORAL_TABLET | Freq: Four times a day (QID) | ORAL | 0 refills | Status: DC | PRN

## 2016-10-05 NOTE — ED Provider Notes (Addendum)
[facility] Emergency Department Provider Note  ____________________________________________   First MD Initiated Contact with Patient 10/05/16 [phone]     (approximate)  I have reviewed the triage vital signs and the nursing notes.   HISTORY  Chief Complaint Pelvic Pain and Vaginal Bleeding    HPI Loretta Warner is a 26 y.o. female with a history of heavy and somewhat irregular menstrual periods who is currently on birth control after being advised to do so about 5 months ago when she presented to the emergency department with similar symptoms.  She presents today for 3-4 days of persistent heavy vaginal bleeding and left lower quadrant pain similar to prior.  She states that her period has come about a week early.  She reports that the pain comes and goes, waxes and wanes in severity, and feels dull, aching, and is severe at its worst but currently is mild.  Ibuprofen normal as can help a little bit, nothing in particular makes it worse.  She denies fever/chills, chest pain, shortness of breath, nausea or vomiting, dysuria, diarrhea, constipation.  She had an ultrasound previously and was told that she had an ovarian cyst that was causing her pain on the left side.  His was about 5 months ago while she was having her period and she was told to start taking birth control which she has been doing.  This is the first time since then that she has had this severe pain although she is currently in no acute distress.  She does not have an OB/GYN and started on birth control by going to the health department.   No past medical history on file.  There are no active problems to display for this patient.   Past Surgical History:  Procedure Laterality Date  . TONSILLECTOMY      Prior to Admission medications   Medication Sig Start Date End Date Taking? Authorizing Provider  acetaminophen-codeine (TYLENOL #3) 300-30 MG tablet Take 1 tablet by mouth every 8  (eight) hours as needed for moderate pain. 06/14/16   Menshew, Charlesetta Ivory, PA-C  benzonatate (TESSALON PERLES) 100 MG capsule Take 1 capsule (100 mg total) by mouth 3 (three) times daily as needed for cough (Take 1-2 per dose). 06/14/16   Menshew, Charlesetta Ivory, PA-C  cyclobenzaprine (FLEXERIL) 10 MG tablet Take 1 tablet (10 mg total) by mouth 3 (three) times daily as needed for muscle spasms. 09/20/15   Darci Current, MD  fluticasone (FLONASE) 50 MCG/ACT nasal spray Place 2 sprays into both nostrils daily. 06/14/16   Menshew, Charlesetta Ivory, PA-C  HYDROcodone-acetaminophen (NORCO/VICODIN) 5-325 MG tablet Take 1-2 tablets by mouth every 6 (six) hours as needed for moderate pain. 10/05/16   Loleta Rose, MD  meloxicam (MOBIC) 15 MG tablet Take 1 tablet (15 mg total) by mouth daily. 02/13/16 02/12/17  Cuthriell, Delorise Royals, PA-C  metoCLOPramide (REGLAN) 10 MG tablet Take 1 tablet (10 mg total) by mouth 3 (three) times daily with meals. 10/22/14 10/22/15  Rebecka Apley, MD  Multiple Vitamin (MULTIVITAMIN) capsule Take 1 capsule by mouth daily.    [provider]  sucralfate (CARAFATE) 1 G tablet Take 1 tablet (1 g total) by mouth 4 (four) times daily. 10/22/14 10/22/15  Rebecka Apley, MD    Allergies Macadamia nut oil  No family history on file.  Social History Social History  Substance Use Topics  . Smoking status: Current Some Day Smoker    Types: Cigarettes  .  Smokeless tobacco: Never Used  . Alcohol use Yes    Review of Systems Constitutional: No fever/chills Eyes: No visual changes. ENT: No sore throat. Cardiovascular: Denies chest pain. Respiratory: Denies shortness of breath. Gastrointestinal: Waxing and waning and intermittent left lower quadrant abdominal pain after starting her period 3-4 days ago Genitourinary: Heavy vaginal bleeding for 3-4 days.  Negative for dysuria. Neurological: Negative for headaches, focal weakness or  numbness.   ____________________________________________   PHYSICAL EXAM:  VITAL SIGNS: ED Triage Vitals  Enc Vitals Group     BP 10/05/16 0048 115/81     Pulse Rate 10/05/16 0048 98     Resp 10/05/16 0048 16     Temp 10/05/16 0048 99 F (37.2 C)     Temp Source 10/05/16 0048 Oral     SpO2 10/05/16 0048 100 %     Weight 10/05/16 0048 64.4 kg (142 lb)     Height 10/05/16 0048 1.651 m (5\' 5" )     Head Circumference --      Peak Flow --      Pain Score 10/05/16 0436 8     Pain Loc --      Pain Edu? --      Excl. in GC? --     Constitutional: Alert and oriented. Well appearing and in no acute distress. Eyes: Conjunctivae are normal.  Cardiovascular: Normal rate, regular rhythm. Good peripheral circulation.  Respiratory: Normal respiratory effort.  No retractions.  Gastrointestinal: Soft With mild tenderness to palpation of the left lower quadrant, no rebound, no guarding Genitourinary: Deferred at patient preference Musculoskeletal: No lower extremity tenderness nor edema. No gross deformities of extremities. Neurologic:  Normal speech and language. No gross focal neurologic deficits are appreciated.  Skin:  Skin is warm, dry and intact. No rash noted. Psychiatric: Mood and affect are normal. Speech and behavior are normal.  ____________________________________________   LABS (all labs ordered are listed, but only abnormal results are displayed)  Labs Reviewed  CBC WITH DIFFERENTIAL/PLATELET - Abnormal; Notable for the following:       Result Value   MCV 100.1 (*)    MCH 34.3 (*)    Platelets 139 (*)    All other components within normal limits  POC URINE PREG, ED  POCT PREGNANCY, URINE   ____________________________________________  EKG  None - EKG not ordered by ED physician ____________________________________________  RADIOLOGY   No results found.  ____________________________________________   PROCEDURES  Critical Care performed:  No   Procedure(s) performed:   Procedures   ____________________________________________   INITIAL IMPRESSION / ASSESSMENT AND PLAN / ED COURSE  Pertinent labs & imaging results that were available during my care of the patient were reviewed by me and considered in my medical decision making (see chart for details).  The patient is currently comfortable.  She has only very mild tenderness to palpation and she has normal vital signs and lab work.  We had a lengthy discussion about the intermittent nature of pain secondary to ovarian cysts and she told me about her prior ultrasound and reevaluation.  Her urine pregnancy test is negative which is reassuring.  I offered a pelvic exam but she very much does not want to have a pelvic exam tonight, and I do not think it is absolutely necessary as part of her treatment and do not want to convince her or force her to have one.  Similarly I offered to repeat a transvaginal ultrasound but she would prefer not to have one  at this time.  Since her symptoms had completely resolved for about 5 months but are now back in the setting of her menstrual cycle, I think it is okay to defer repeat ultrasound.  She has no signs or symptoms consistent with torsion.  We discussed the possibility that she may have some endometriosis since the symptoms are consistently on the left side and I encouraged her strongly to establish care with an OB/GYN for further evaluation.  He understands and agrees with this plan.  I encouraged her to continue using NSAIDs and Tylenol will give her a few Norco to help in the immediate term with the pain that is making it difficult for her to sleep.  I gave my usual and customary return precautions.      Clinical Course as of Oct 05 656  Sat Oct 05, 2016  [id number] I reviewed the patient's prescription history over the last 12 months in the multi-state controlled substances database(s) that includes [place], [place], [place], [place], [place], [place], [place], [place], [place], [place], [place], [place], and [place].  Results were notable for only 1 prescription for Tylenol 3 prescribed about 3-1/2 months ago.  [CF]    Clinical Course User Index [CF] Loleta RoseForbach, Kymberley Raz, MD    ____________________________________________  FINAL CLINICAL IMPRESSION(S) / ED DIAGNOSES  Final diagnoses:  Dysfunctional uterine bleeding  LLQ pain     MEDICATIONS GIVEN DURING THIS VISIT:  Medications - No data to display   NEW OUTPATIENT MEDICATIONS STARTED DURING THIS VISIT:  New Prescriptions   HYDROCODONE-ACETAMINOPHEN (NORCO/VICODIN) 5-325 MG TABLET    Take 1-2 tablets by mouth every 6 (six) hours as needed for moderate pain.    Modified Medications   No medications on file    Discontinued Medications   No medications on file     Note:  This document was prepared using Dragon voice recognition software and may include unintentional dictation errors.    Loleta RoseForbach, Amberia Bayless, MD 10/05/16 [phone]    Loleta RoseForbach, Kendel Pesnell, MD 10/05/16 [phone]

## 2016-10-05 NOTE — ED Triage Notes (Signed)
Pt states history of left sided ovarian cyst. Pt states she began to have sharp cramping pain last pm to left sided of pelvis accompanied by vaginal bleeding. Pt states she has used 5 tampons in 24 hours. Pt states she is one week early for menstration.

## 2016-10-05 NOTE — Discharge Instructions (Signed)
As we discussed your lab work was reassuring today.  We believe you are once again having pain associated with her menstrual cycle and likely due to an ovarian cyst.  We strongly encourage you, since this is a recurring problem, to follow up with a GYN provider such as Dr. Elesa MassedWard at [facility] or one of her colleagues.  They may do additional testing including but not limited to a repeat ultrasound.  Please continue taking your regular medications.  Use over-the-counter ibuprofen and Tylenol according to the label instructions.  Take Norco as prescribed for severe pain. Do not drink alcohol, drive or participate in any other potentially dangerous activities while taking this medication as it may make you sleepy. Do not take this medication with any other sedating medications, either prescription or over-the-counter. If you were prescribed Percocet or Vicodin, do not take these with acetaminophen (Tylenol) as it is already contained within these medications.   This medication is an opiate (or narcotic) pain medication and can be habit forming.  Use it as little as possible to achieve adequate pain control.  Do not use or use it with extreme caution if you have a history of opiate abuse or dependence.  If you are on a pain contract with your primary care doctor or a pain specialist, be sure to let them know you were prescribed this medication today from [facility].  This medication is intended for your use only - do not give any to anyone else and keep it in a secure place where nobody else, especially children, have access to it.  It will also cause or worsen constipation, so you may want to consider taking an over-the-counter stool softener while you are taking this medication.

## 2017-06-14 ENCOUNTER — Emergency Department: Payer: Medicaid Other

## 2017-06-14 ENCOUNTER — Encounter: Payer: Self-pay | Admitting: Emergency Medicine

## 2017-06-14 ENCOUNTER — Other Ambulatory Visit: Payer: Self-pay

## 2017-06-14 ENCOUNTER — Emergency Department
Admission: EM | Admit: 2017-06-14 | Discharge: 2017-06-14 | Disposition: A | Discharge status: Home / Self Care | Payer: Medicaid Other | Attending: Emergency Medicine | Admitting: Emergency Medicine

## 2017-06-14 DIAGNOSIS — J181 Lobar pneumonia, unspecified organism: Secondary | ICD-10-CM | POA: Insufficient documentation

## 2017-06-14 DIAGNOSIS — F17210 Nicotine dependence, cigarettes, uncomplicated: Secondary | ICD-10-CM | POA: Insufficient documentation

## 2017-06-14 DIAGNOSIS — J189 Pneumonia, unspecified organism: Secondary | ICD-10-CM

## 2017-06-14 DIAGNOSIS — Z79899 Other long term (current) drug therapy: Secondary | ICD-10-CM | POA: Insufficient documentation

## 2017-06-14 DIAGNOSIS — R197 Diarrhea, unspecified: Secondary | ICD-10-CM | POA: Insufficient documentation

## 2017-06-14 MED ORDER — AZITHROMYCIN 250 MG PO TABS: ORAL_TABLET | ORAL | 0 refills | Status: DC

## 2017-06-14 MED ORDER — CEFTRIAXONE SODIUM 1 G IJ SOLR
1.0000 g | Freq: Once | INTRAMUSCULAR | Status: AC
  Administered 2017-06-14: 1 g via INTRAMUSCULAR
  Filled 2017-06-14: qty 10

## 2017-06-14 MED ORDER — ALBUTEROL SULFATE HFA 108 (90 BASE) MCG/ACT IN AERS: 2.0000 | INHALATION_SPRAY | Freq: Four times a day (QID) | RESPIRATORY_TRACT | 0 refills | Status: DC | PRN

## 2017-06-14 MED ORDER — IPRATROPIUM-ALBUTEROL 0.5-2.5 (3) MG/3ML IN SOLN
3.0000 mL | Freq: Once | RESPIRATORY_TRACT | Status: AC
Start: 2017-06-14 — End: 2017-06-14
  Administered 2017-06-14: 3 mL via RESPIRATORY_TRACT
  Filled 2017-06-14: qty 3

## 2017-06-14 NOTE — ED Provider Notes (Signed)
[facility] Emergency Department Provider Note  ____________________________________________  Time seen: Approximately 2:00 PM  I have reviewed the triage vital signs and the nursing notes.   HISTORY  Chief Complaint Cough and Diarrhea    HPI Loretta Warner is a 27 y.o. female that presents to the emergency department for evaluation of nonproductive cough for 5 days.  She is having pain in her chest with coughing.  Patient smokes about 2 blacks per day.  She denies fever, chills, shortness of breath, nausea, vomiting, abdominal pain, body aches.   History reviewed. No pertinent past medical history.  There are no active problems to display for this patient.   Past Surgical History:  Procedure Laterality Date  . TONSILLECTOMY      Prior to Admission medications   Medication Sig Start Date End Date Taking? Authorizing Provider  acetaminophen-codeine (TYLENOL #3) 300-30 MG tablet Take 1 tablet by mouth every 8 (eight) hours as needed for moderate pain. 06/14/16   Menshew, Charlesetta IvoryJenise V Bacon, PA-C  albuterol (PROVENTIL HFA;VENTOLIN HFA) 108 (90 Base) MCG/ACT inhaler Inhale 2 puffs into the lungs every 6 (six) hours as needed for wheezing or shortness of breath. 06/14/17   Enid DerryWagner, Damond Borchers, PA-C  azithromycin (ZITHROMAX Z-PAK) 250 MG tablet Take 2 tablets (500 mg) on  Day 1,  followed by 1 tablet (250 mg) once daily on Days 2 through 5. 06/14/17   Enid DerryWagner, Holly Pring, PA-C  benzonatate (TESSALON PERLES) 100 MG capsule Take 1 capsule (100 mg total) by mouth 3 (three) times daily as needed for cough (Take 1-2 per dose). 06/14/16   Menshew, Charlesetta IvoryJenise V Bacon, PA-C  cyclobenzaprine (FLEXERIL) 10 MG tablet Take 1 tablet (10 mg total) by mouth 3 (three) times daily as needed for muscle spasms. 09/20/15   Darci CurrentBrown, Fillmore N, MD  fluticasone (FLONASE) 50 MCG/ACT nasal spray Place 2 sprays into both nostrils daily. 06/14/16   Menshew, Charlesetta IvoryJenise V Bacon, PA-C  HYDROcodone-acetaminophen  (NORCO/VICODIN) 5-325 MG tablet Take 1-2 tablets by mouth every 6 (six) hours as needed for moderate pain. 10/05/16   Loleta RoseForbach, Cory, MD  metoCLOPramide (REGLAN) 10 MG tablet Take 1 tablet (10 mg total) by mouth 3 (three) times daily with meals. 10/22/14 10/22/15  Rebecka ApleyWebster, Allison P, MD  Multiple Vitamin (MULTIVITAMIN) capsule Take 1 capsule by mouth daily.    [provider]  sucralfate (CARAFATE) 1 G tablet Take 1 tablet (1 g total) by mouth 4 (four) times daily. 10/22/14 10/22/15  Rebecka ApleyWebster, Allison P, MD    Allergies Macadamia nut oil  History reviewed. No pertinent family history.  Social History Social History   Tobacco Use  . Smoking status: Current Some Day Smoker    Types: Cigarettes  . Smokeless tobacco: Never Used  . Tobacco comment: black and milds  Substance Use Topics  . Alcohol use: No    Frequency: Never  . Drug use: Not on file     Review of Systems  Constitutional: No fever/chills Eyes: No visual changes. No discharge. ENT: Negative for congestion and rhinorrhea. Cardiovascular: No chest pain. Respiratory: Positive for cough. No SOB. Gastrointestinal: No abdominal pain.  No nausea, no vomiting.  No diarrhea.  No constipation. Musculoskeletal: Negative for musculoskeletal pain. Skin: Negative for rash, abrasions, lacerations, ecchymosis. Neurological: Negative for headaches.   ____________________________________________   PHYSICAL EXAM:  VITAL SIGNS: ED Triage Vitals  Enc Vitals Group     BP 06/14/17 1244 131/73     Pulse Rate 06/14/17 1244 68  Resp --      Temp 06/14/17 1244 98.8 F (37.1 C)     Temp src --      SpO2 06/14/17 1244 97 %     Weight 06/14/17 1245 130 lb (59 kg)     Height 06/14/17 1245 5\' 5"  (1.651 m)     Head Circumference --      Peak Flow --      Pain Score 06/14/17 1244 7     Pain Loc --      Pain Edu? --      Excl. in GC? --      Constitutional: Alert and oriented. Well appearing and in no acute distress. Eyes:  Conjunctivae are normal. PERRL. EOMI. No discharge. Head: Atraumatic. ENT: No frontal and maxillary sinus tenderness.      Ears: Tympanic membranes pearly gray with good landmarks. No discharge.      Nose: No congestion/rhinnorhea.      Mouth/Throat: Mucous membranes are moist. Oropharynx non-erythematous. Tonsils not enlarged. No exudates. Uvula midline. Neck: No stridor.   Hematological/Lymphatic/Immunilogical: No cervical lymphadenopathy. Cardiovascular: Normal rate, regular rhythm.  Good peripheral circulation. Respiratory: Normal respiratory effort without tachypnea or retractions. Lungs CTAB. Good air entry to the bases with no decreased or absent breath sounds. Gastrointestinal: Bowel sounds 4 quadrants. Soft and nontender to palpation. No guarding or rigidity. No palpable masses. No distention. Musculoskeletal: Full range of motion to all extremities. No gross deformities appreciated. Neurologic:  Normal speech and language. No gross focal neurologic deficits are appreciated.  Skin:  Skin is warm, dry and intact. No rash noted.   ____________________________________________   LABS (all labs ordered are listed, but only abnormal results are displayed)  Labs Reviewed - No data to display ____________________________________________  EKG   ____________________________________________  RADIOLOGY Loretta Warner, personally viewed and evaluated these images (plain radiographs) as part of my medical decision making, as well as reviewing the written report by the radiologist.  Dg Chest 2 View  Result Date: 06/14/2017 CLINICAL DATA:  Nonproductive cough. Intermittent shortness of breath. EXAM: CHEST - 2 VIEW COMPARISON:  05/27/2008 FINDINGS: Right middle lobe airspace disease. Lungs otherwise clear. Normal heart size. No pneumothorax or pleural effusion. IMPRESSION: Right middle lobe pneumonia. Electronically Signed   By: Jolaine Click M.D.   On: 06/14/2017 13:55     ____________________________________________    PROCEDURES  Procedure(s) performed:    Procedures    Medications  ipratropium-albuterol (DUONEB) 0.5-2.5 (3) MG/3ML nebulizer solution 3 mL (3 mLs Nebulization Given 06/14/17 1408)  cefTRIAXone (ROCEPHIN) injection 1 g (1 g Intramuscular Given 06/14/17 1524)     ____________________________________________   INITIAL IMPRESSION / ASSESSMENT AND PLAN / ED COURSE  Pertinent labs & imaging results that were available during my care of the patient were reviewed by me and considered in my medical decision making (see chart for details).  Review of the [place] CSRS was performed in accordance of the [facility] prior to dispensing any controlled drugs.   Patient's diagnosis is consistent with pneumonia. Vital signs and exam are reassuring.  Chest x-ray consistent with pneumonia.  Patient appears well and is staying well hydrated. Patient feels comfortable going home. Patient was given a duoneb and IM ceftriaxone. Patient will be discharged home with prescriptions for azithromycin and albuterol inhaler. Patient is to follow up with PCP as needed or otherwise directed. Patient is given ED precautions to return to the ED for any worsening or new symptoms.     ____________________________________________  FINAL  CLINICAL IMPRESSION(S) / ED DIAGNOSES  Final diagnoses:  Community acquired pneumonia of right middle lobe of lung (HCC)      NEW MEDICATIONS STARTED DURING THIS VISIT:  ED Discharge Orders        Ordered    azithromycin (ZITHROMAX Z-PAK) 250 MG tablet     06/14/17 1450    albuterol (PROVENTIL HFA;VENTOLIN HFA) 108 (90 Base) MCG/ACT inhaler  Every 6 hours PRN     06/14/17 1450          This chart was dictated using voice recognition software/Dragon. Despite best efforts to proofread, errors can occur which can change the meaning. Any change was purely unintentional.    Enid Derry, PA-C 06/14/17 1552    Jene Every, MD 06/15/17 [id number]

## 2017-06-14 NOTE — ED Triage Notes (Signed)
Pt states cough 6 days with diarrhea starting yesterday.

## 2017-06-14 NOTE — ED Notes (Signed)
Pt stating "cold" since Sunday. Pt stating cough, chills, and diarrhea. Pt stating decreased oral intake also. Pt lungs are clear bilaterally. Pt stating that she is coughing up just clear.

## 2017-09-24 IMAGING — US US TRANSVAGINAL NON-OB
1 series · 13 of 25 positions shown · non-contrast
Comparison: 08/07/2006 CT

CLINICAL DATA: Left lower quadrant pain x3 days.  Irregular menses.



[Series 1: us transvaginal non-ob · 0.20mm/px · 13 of 108 slices shown]
[im 1/108]
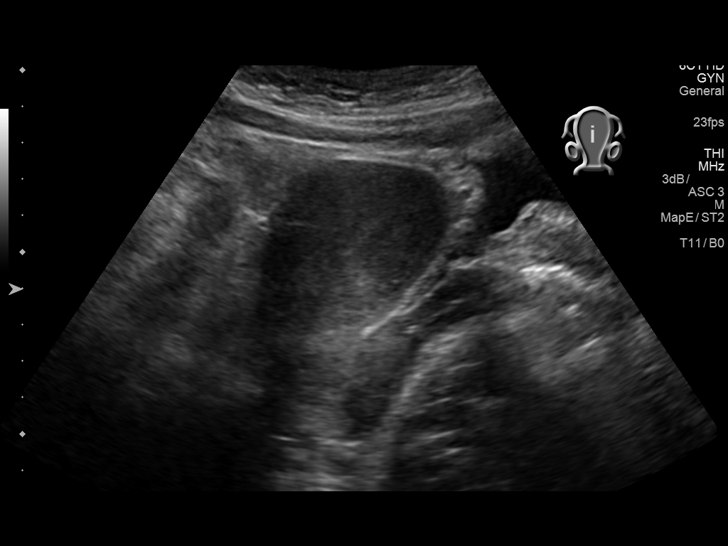
[im 9/108]
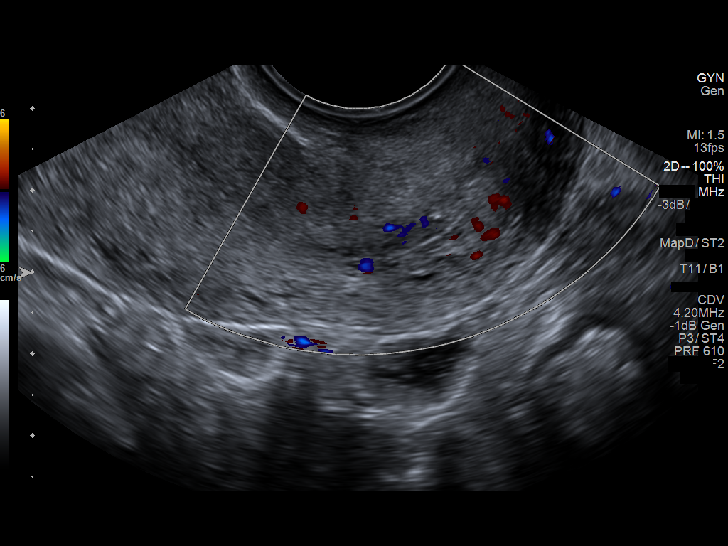
[im 18/108]
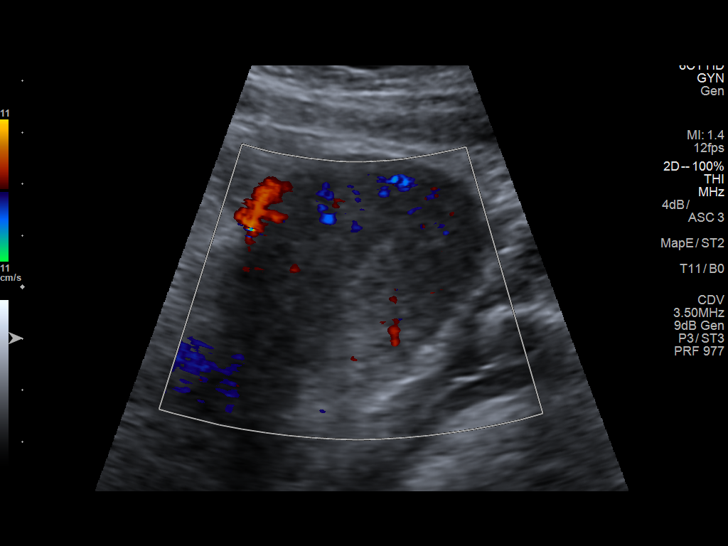
[im 27/108]
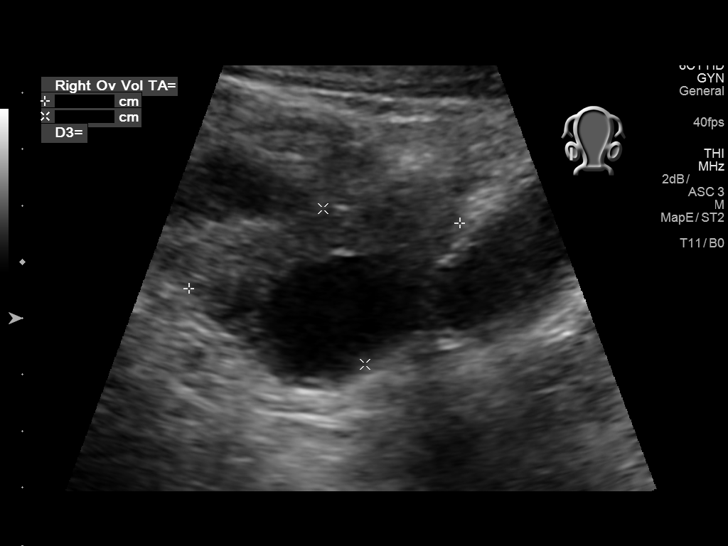
[im 36/108]
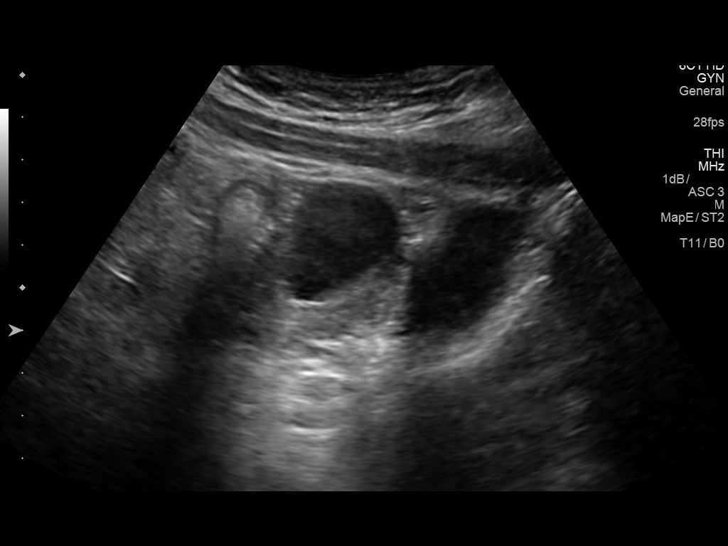
[im 45/108]
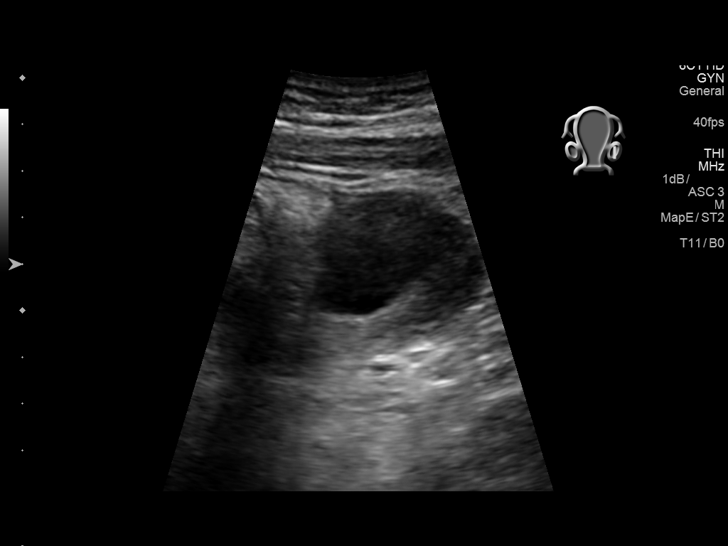
[im 54/108]
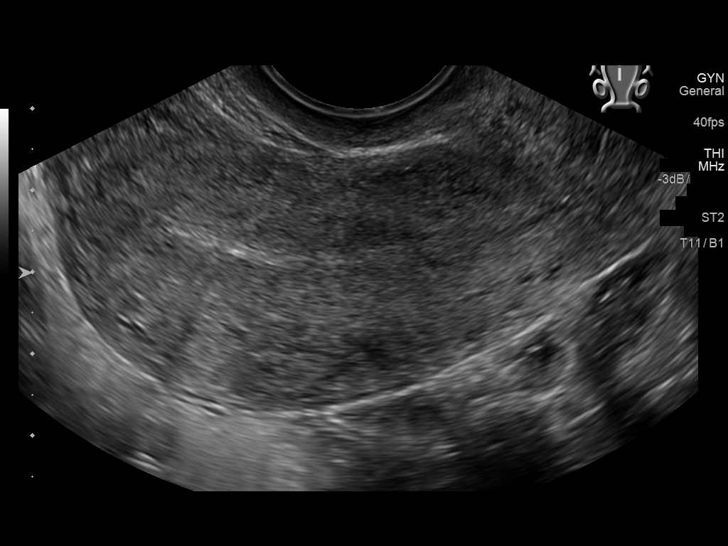
[im 63/108]
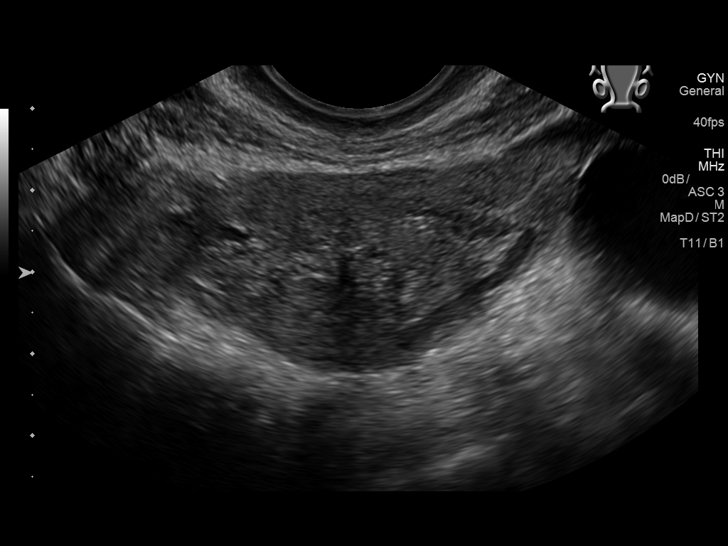
[im 72/108]
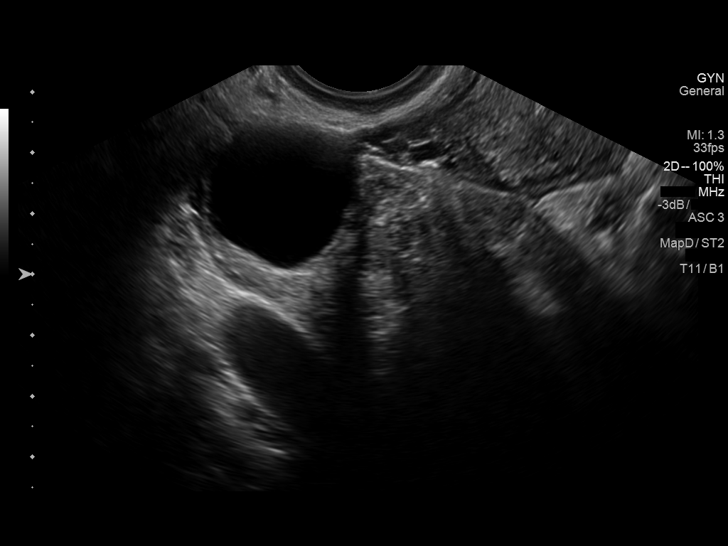
[im 81/108]
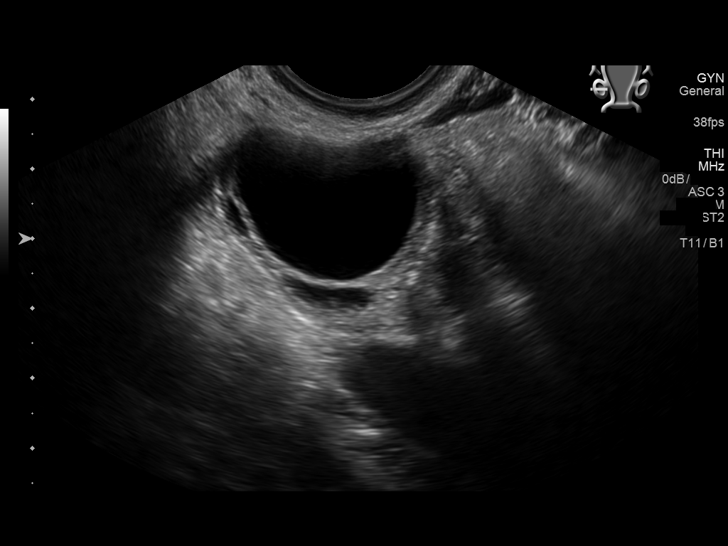
[im 90/108]
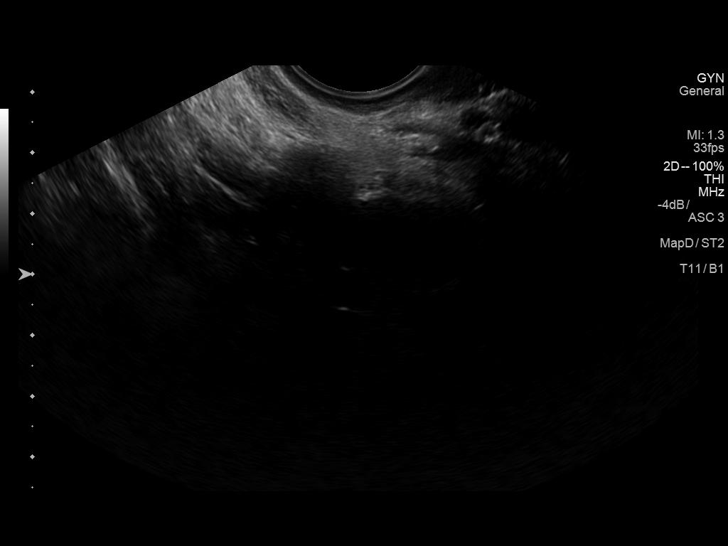
[im 99/108]
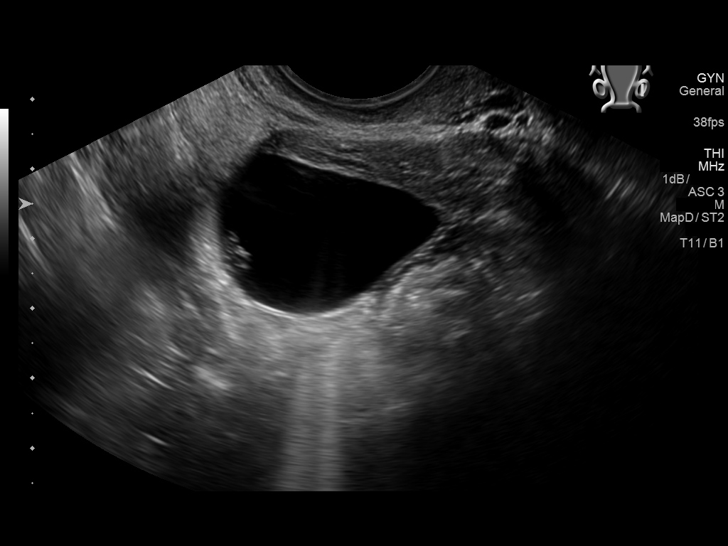
[im 108/108]
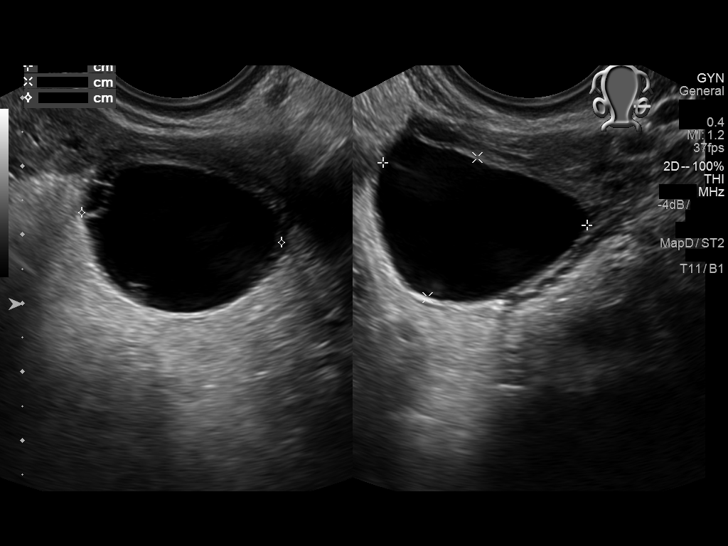

[13 of 25 positions shown; findings below may reference images not displayed]

FINDINGS: Uterus

Measurements: 7.7 x 4.6 x 5.6 cm. The uterus is anteverted.. No
fibroids or other mass visualized.

Endometrium

Thickness: 3.8 mm.  No focal abnormality visualized.

Right ovary

Measurements: 4.1 x 3.4 x 3.3 cm with 2.9 x 2.2 x 2.6 cm follicle
noted.. Normal appearance/no adnexal mass.

Left ovary

Measurements: 4.3 x 2.7 x 3.2 cm with a 3.1 x 2.2 x 3 cm complex
cyst containing 2-3 internal daughter cysts measuring up to 4 mm
seen within.

Other findings

No abnormal free fluid.
IMPRESSION: Complex left ovarian cyst measuring 3.1 x 2.2 x 3 cm with internal
daughter cysts/ thin septations. Findings are likely benign but
recommend 6-12 week follow-up to ensure resolution.

## 2017-11-24 ENCOUNTER — Ambulatory Visit (INDEPENDENT_AMBULATORY_CARE_PROVIDER_SITE_OTHER): Payer: Self-pay | Admitting: Obstetrics and Gynecology

## 2017-11-24 ENCOUNTER — Other Ambulatory Visit (HOSPITAL_COMMUNITY)
Admission: RE | Admit: 2017-11-24 | Discharge: 2017-11-24 | Disposition: A | Discharge status: Home / Self Care | Payer: Self-pay | Source: Ambulatory Visit | Attending: Obstetrics and Gynecology | Admitting: Obstetrics and Gynecology

## 2017-11-24 ENCOUNTER — Encounter: Payer: Self-pay | Admitting: Obstetrics and Gynecology

## 2017-11-24 ENCOUNTER — Ambulatory Visit: Payer: Medicaid Other | Attending: Oncology | Admitting: *Deleted

## 2017-11-24 ENCOUNTER — Encounter: Payer: Self-pay | Admitting: *Deleted

## 2017-11-24 VITALS — BP 98/62 | HR 76 | Temp 98.7°F | Ht 65.0 in | Wt 126.0 lb

## 2017-11-24 VITALS — BP 110/66 | HR 82 | Ht 65.0 in | Wt 136.0 lb

## 2017-11-24 DIAGNOSIS — R87610 Atypical squamous cells of undetermined significance on cytologic smear of cervix (ASC-US): Secondary | ICD-10-CM

## 2017-11-24 DIAGNOSIS — R87810 Cervical high risk human papillomavirus (HPV) DNA test positive: Secondary | ICD-10-CM

## 2017-11-24 NOTE — Progress Notes (Signed)
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  Patient states she has never had an abnormal pap smear before.  She is anxious and tearful.  Reviewed pap results and need for colposcopy and possible biopsy.  States she had the "vaccine".  Answered her questions.  She was also encouraged to ask Dr. Bonney AidStaebler.  She has an appointment with today for her colpocopy.  Patient has been screened for eligibility.  She does not have any insurance, Medicare or Medicaid.  She also meets financial eligibility.  Hand-out given on the Affordable Care Act.

## 2017-11-24 NOTE — Progress Notes (Signed)
Obstetrics & Gynecology Office Visit   Chief Complaint:  Chief Complaint  Patient presents with  . Colposcopy    Referred by [facility]    History of Present Illness:Loretta Warner is a 27 y.o. woman who presents today for continued surveillance for history of dysplasia. Last pap obtained on 10/30/2017 revealed ASCUS HPV positive.  No prior diagnostic procedures for cervical dysplasia.  The patient is a current smoker.     Pap/Treatment History:  10/30/2017 ASCUS HPV positive 07/31/2012 NIL   Review of Systems: Review of Systems  Constitutional: Negative.   Gastrointestinal: Negative.   Genitourinary: Negative.   Skin: Negative.      Past Medical History:  History reviewed. No pertinent past medical history.  Past Surgical History:  Past Surgical History:  Procedure Laterality Date  . TONSILLECTOMY      Gynecologic History: Patient's last menstrual period was 11/19/2017 (exact date).  Obstetric History: No obstetric history on file.  Family History:  History reviewed. No pertinent family history.  Social History:  Social History   Socioeconomic History  . Marital status: Single    Spouse name: Not on file  . Number of children: Not on file  . Years of education: Not on file  . Highest education level: Not on file  Occupational History  . Not on file  Social Needs  . Financial resource strain: Not on file  . Food insecurity:    Worry: Not on file    Inability: Not on file  . Transportation needs:    Medical: Not on file    Non-medical: Not on file  Tobacco Use  . Smoking status: Current Some Day Smoker    Types: Cigarettes  . Smokeless tobacco: Never Used  . Tobacco comment: black and milds  Substance and Sexual Activity  . Alcohol use: No    Frequency: Never  . Drug use: Yes    Types: Marijuana  . Sexual activity: Yes    Birth control/protection: Patch  Lifestyle  . Physical activity:    Days per week: Not on file    Minutes per  session: Not on file  . Stress: Not on file  Relationships  . Social connections:    Talks on phone: Not on file    Gets together: Not on file    Attends religious service: Not on file    Active member of club or organization: Not on file    Attends meetings of clubs or organizations: Not on file    Relationship status: Not on file  . Intimate partner violence:    Fear of current or ex partner: Not on file    Emotionally abused: Not on file    Physically abused: Not on file    Forced sexual activity: Not on file  Other Topics Concern  . Not on file  Social History Narrative  . Not on file    Allergies:  Allergies  Allergen Reactions  . Macadamia Nut Oil Anaphylaxis    Medications: Prior to Admission medications   Medication Sig Start Date End Date Taking? Authorizing Provider  acetaminophen-codeine (TYLENOL #3) 300-30 MG tablet Take 1 tablet by mouth every 8 (eight) hours as needed for moderate pain. 06/14/16   Menshew, Charlesetta Ivory, PA-C  albuterol (PROVENTIL HFA;VENTOLIN HFA) 108 (90 Base) MCG/ACT inhaler Inhale 2 puffs into the lungs every 6 (six) hours as needed for wheezing or shortness of breath. 06/14/17   Enid Derry, PA-C  azithromycin (ZITHROMAX Z-PAK)  250 MG tablet Take 2 tablets (500 mg) on  Day 1,  followed by 1 tablet (250 mg) once daily on Days 2 through 5. 06/14/17   Enid DerryWagner, Ashley, PA-C  benzonatate (TESSALON PERLES) 100 MG capsule Take 1 capsule (100 mg total) by mouth 3 (three) times daily as needed for cough (Take 1-2 per dose). 06/14/16   Menshew, Charlesetta IvoryJenise V Bacon, PA-C  cyclobenzaprine (FLEXERIL) 10 MG tablet Take 1 tablet (10 mg total) by mouth 3 (three) times daily as needed for muscle spasms. 09/20/15   Darci CurrentBrown,  AFB N, MD  fluticasone (FLONASE) 50 MCG/ACT nasal spray Place 2 sprays into both nostrils daily. 06/14/16   Menshew, Charlesetta IvoryJenise V Bacon, PA-C  HYDROcodone-acetaminophen (NORCO/VICODIN) 5-325 MG tablet Take 1-2 tablets by mouth every 6 (six) hours as  needed for moderate pain. 10/05/16   Loleta RoseForbach, Cory, MD  metoCLOPramide (REGLAN) 10 MG tablet Take 1 tablet (10 mg total) by mouth 3 (three) times daily with meals. 10/22/14 10/22/15  Rebecka ApleyWebster, Allison P, MD  Multiple Vitamin (MULTIVITAMIN) capsule Take 1 capsule by mouth daily.    [provider]  sucralfate (CARAFATE) 1 G tablet Take 1 tablet (1 g total) by mouth 4 (four) times daily. 10/22/14 10/22/15  Rebecka ApleyWebster, Allison P, MD    Physical Exam Vitals:  Vitals:   11/24/17 1417  BP: 110/66  Pulse: 82   Patient's last menstrual period was 11/19/2017 (exact date).  General: NAD HEENT: normocephalic, anicteric Pulmonary: No increased work of breathing Genitourinary:  External: Normal external female genitalia.  Normal urethral meatus, normal  Bartholin's and Skene's glands.    Vagina: Normal vaginal mucosa, no evidence of prolapse.    Cervix: Grossly normal in appearance, no bleeding Extremities: no edema, erythema, or tenderness Neurologic: Grossly intact Psychiatric: mood appropriate, affect full  Female chaperone present for pelvic and breast  portions of the physical exam  GYNECOLOGY CLINIC COLPOSCOPY PROCEDURE NOTE  27 y.o. No obstetric history on file. here for colposcopy for ASCUS with POSITIVE high risk HPV  pap smear on 10/30/2017. Discussed underlying role for HPV infection in the development of cervical dysplasia, its natural history and progression/regression, need for surveillance.  Is the patient  pregnant: No LMP: Patient's last menstrual period was 11/19/2017 (exact date). Smoking status:  reports that she has been smoking cigarettes. She has never used smokeless tobacco. Contraception: Ortho-Evra patches weekly History of STD:  No Future fertility desired:  Yes  Patient given informed consent, signed copy in the chart, time out was performed.  The patient was position in dorsal lithotomy position. Speculum was placed the cervix was visualized.   After application  of acetic acid colposcopic inspection of the cervix was undertaken.   Colposcopy adequate, full visualization of transformation zone: Yes acetowhite lesion(s) noted at 3 o'clock; corresponding biopsies obtained.   ECC specimen obtained:  Yes  All specimens were labeled and sent to pathology.   Patient was given post procedure instructions.  Will follow up pathology and manage accordingly.  Routine preventative health maintenance measures emphasized.  OBGyn Exam      Assessment: 27 y.o. No obstetric history on file. follow up for ASCUS HPV positive pap  Plan: Problem List Items Addressed This Visit    None    Visit Diagnoses    ASCUS with positive high risk HPV cervical    -  Primary   Relevant Orders   Surgical pathology      - Colposcopy per ASCCP guidelines.  Follow up recommendation pending pathology results  but anticipate CIN I or less with repeat pap and co-testing in 1 year.  - We discussed that 80% of the population will have exposure to human papilloma virus (HPV) during their lifetime.  HPV is a large group of viruses, and are also the causative virus for common warts and genital warts.  The pap smear tests for 13 high risk HPV strains that have some association with cervical cancer, but do not cause visual lesion such as warts.  HPV type 16 and 18 have the highest association with cervical cancer.  The vast majority of HPV infections will be uncomplicated at clear spontaneously in 12-18 months in non-immunocompromised patients.  Patient with compromised immune systems, those taking immunosuppressive drugs, or smoker have shown to have a lower clearance rate and higher persistence of HPV infection.    Currently there are no FDA approved treatments to promote HPV clearance.  Gardasil vaccination is available to prevent HPV infection, but this is only beneficial pre-exposure.  Abstaining from intercourse will not increase clearance  Lastly we stressed that if properly  followed HPV should not lead to cervical cancer.   The goal of screening is to identify patient who develop precancerous lesions of the cervix and treat these prior to progression to frank cervical cancer.  The incidence of cervical cancer is 7 cases per 100,000 women in the [place] a year.  This relatively low rate is in part due to universal screening as well as vaccination efforts.    - She is comfortable with the plan and had her questions answered.  - Given samples of Xulane has not picked up prescription  - Return in about 1 year (around 11/25/2018) for annual [facility] or [facility].   Vena Austria, MD, [facility], [facility] 11/24/2017, 4:40 PM

## 2017-11-28 ENCOUNTER — Encounter: Payer: Self-pay | Admitting: Obstetrics and Gynecology

## 2017-11-28 NOTE — Progress Notes (Signed)
     November 28, 2017   Patient: Loretta Warner  Date of Birth: 01-05-1991  Date of Visit: 11/28/2017    To Whom It May Concern:  It is my medical opinion that Loretta Warner was seen in clinic on 11/24/2017 for an in office procedure  If you have any questions or concerns, please don't hesitate to call.  Sincerely,  Vena AustriaAndreas Velma Hanna, MD  Vena AustriaAndreas Keiosha Cancro, MD, [facility]inda FrederickFACOG [facility], [facility] 11/28/2017, 12:27 PM

## 2017-12-22 ENCOUNTER — Encounter: Payer: Self-pay | Admitting: *Deleted

## 2017-12-22 NOTE — Progress Notes (Signed)
Letter mailed to inform patient of her next appointment for follow up pap in one year on 11/04/18 @ 11:00.  [facility] [place].

## 2018-07-29 ENCOUNTER — Other Ambulatory Visit: Payer: Self-pay | Admitting: Family Medicine

## 2018-08-09 IMAGING — CR DG CHEST 2V
1 series · 2 of 2 positions shown · non-contrast
Comparison: 05/27/2008

CLINICAL DATA: Nonproductive cough. Intermittent shortness of
breath.

EXAM:
CHEST - 2 VIEW

[Series 1: w chest pa · 0.14mm/px · 2 of 2 slices shown]
[im 1/2]
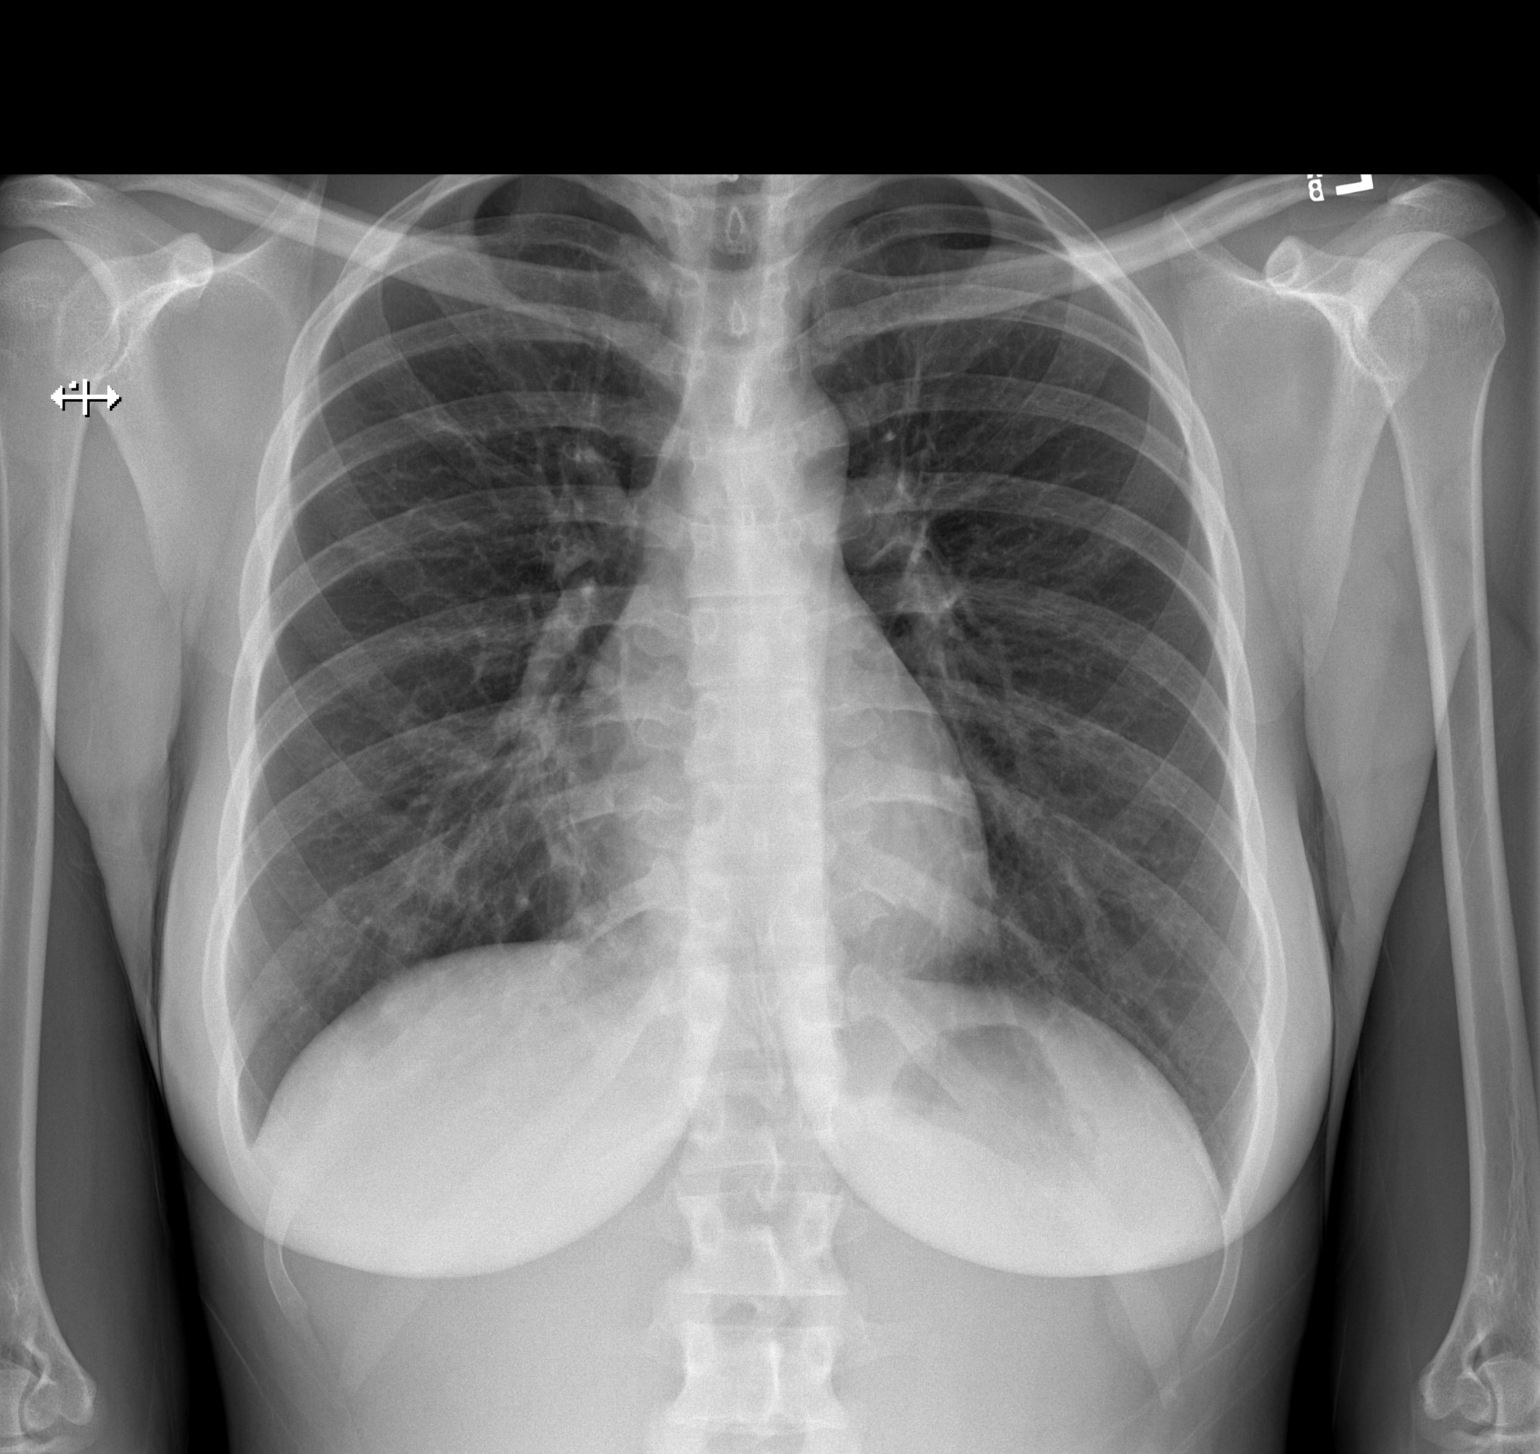
[im 2/2]
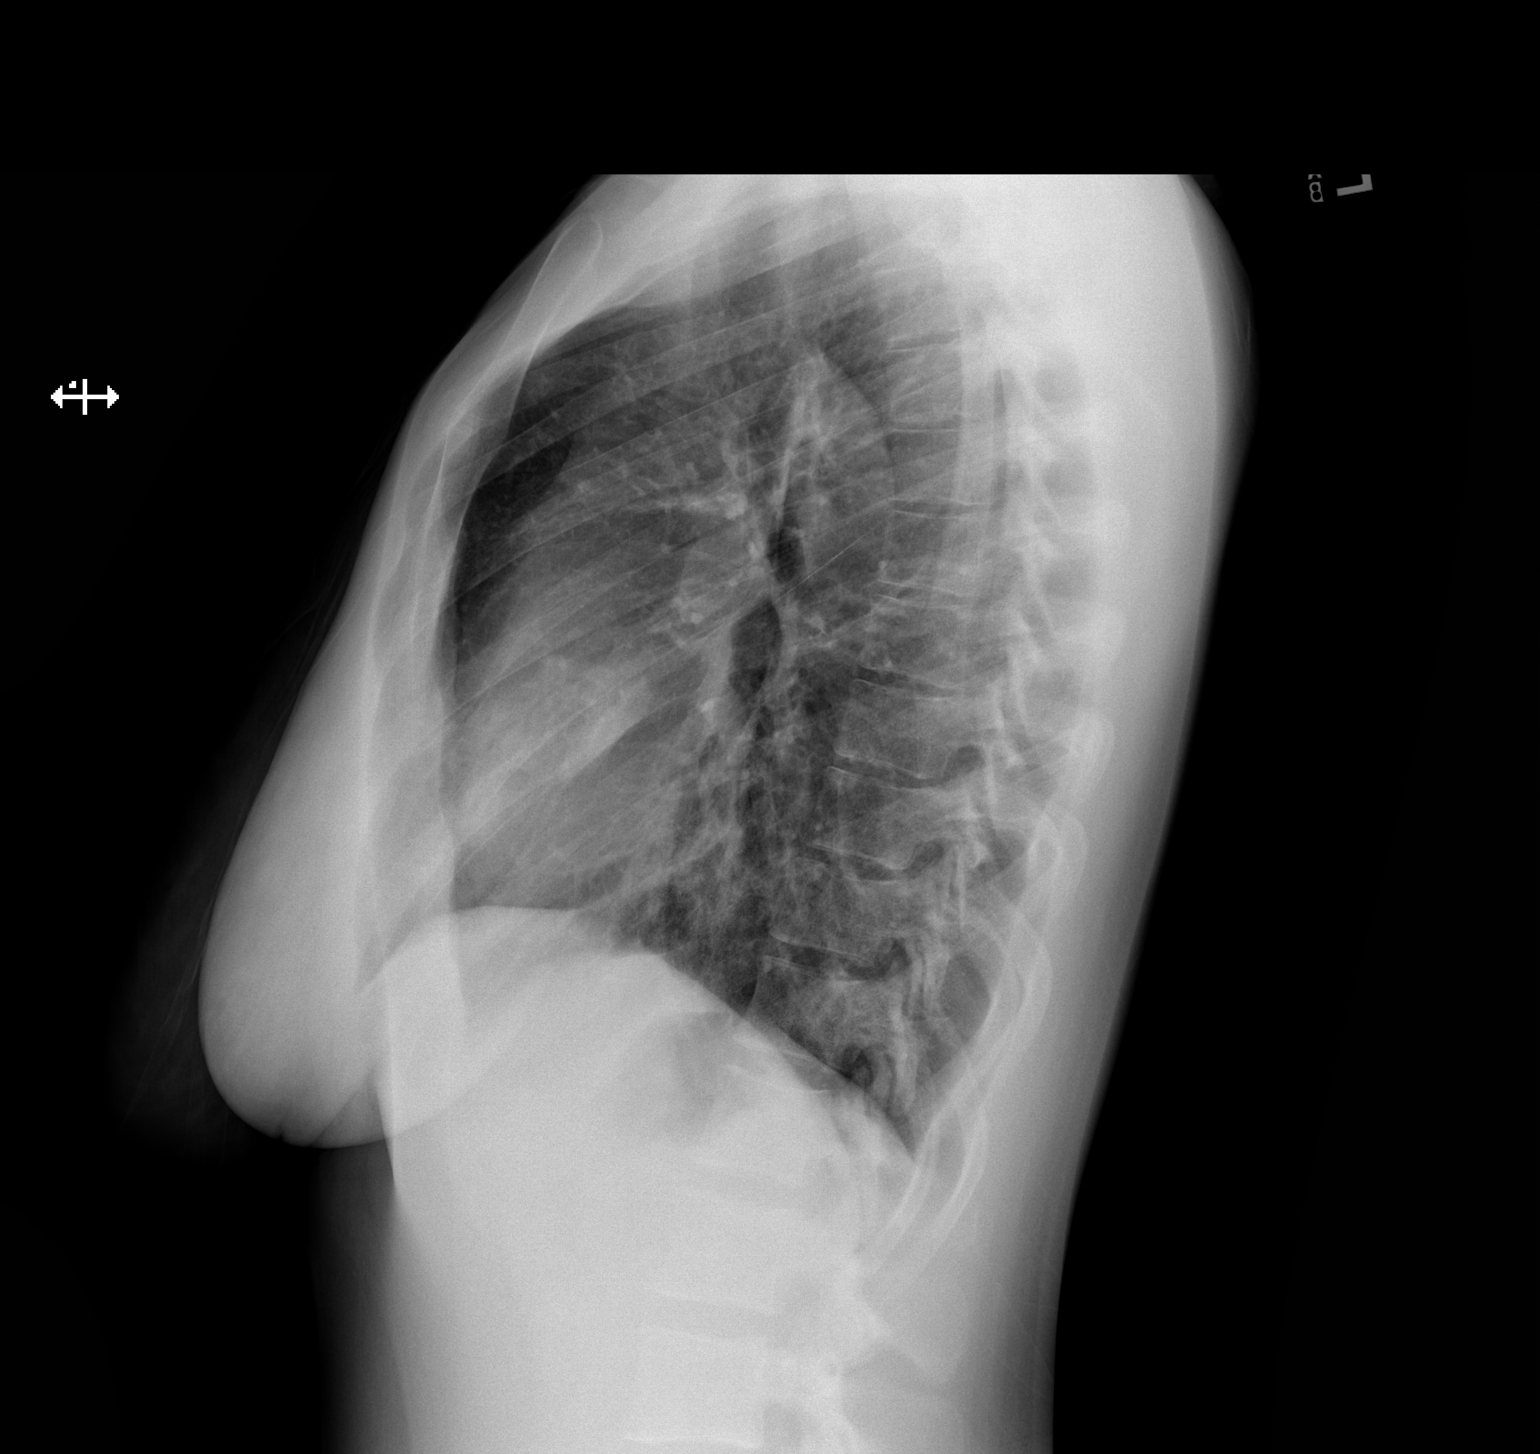

[2 of 2 positions shown; findings below may reference images not displayed]

FINDINGS: Right middle lobe airspace disease. Lungs otherwise clear. Normal
heart size. No pneumothorax or pleural effusion.
IMPRESSION: Right middle lobe pneumonia.

## 2018-10-22 ENCOUNTER — Other Ambulatory Visit: Payer: Self-pay | Admitting: Family Medicine

## 2018-10-22 ENCOUNTER — Telehealth: Payer: Self-pay

## 2018-10-22 NOTE — Telephone Encounter (Signed)
wants more refills on her patch ([facility] on [address] near [place]) Patient runs out on friday

## 2018-10-22 NOTE — Telephone Encounter (Signed)
TC to patient to let her know that provider called her prescription in to her pharmacy. Patient advised that when she calls again for refills, to ask about scheduling a yearly physical exam at that time. Patient in agreement.Jenetta Downer, RN

## 2018-11-03 ENCOUNTER — Other Ambulatory Visit: Payer: Self-pay

## 2018-11-04 ENCOUNTER — Ambulatory Visit: Payer: BC Managed Care – PPO | Attending: Oncology | Admitting: *Deleted

## 2018-11-04 ENCOUNTER — Other Ambulatory Visit: Payer: Self-pay

## 2018-11-04 VITALS — BP 122/82 | HR 84 | Temp 98.9°F | Ht 65.5 in | Wt 147.7 lb

## 2018-11-04 DIAGNOSIS — Z0000 Encounter for general adult medical examination without abnormal findings: Secondary | ICD-10-CM

## 2018-11-04 NOTE — Progress Notes (Signed)
Patient returns today for 1 year follow up pap smear as recommended by Dr. Georgianne Fick.  Patient now has [employer] and is not eligible for [employer].  Discussed need to return to Dr. Georgianne Fick for pap and to establish a primary GYN for her care.  Appointment scheduled to see him on 11/19/18 @ 8:30 in the [place] office.  Number given to call when she arrives.  Offered patient a gas voucher for her inconvenience, but she declined.  She stated she was fine and appreciative for her care.

## 2018-11-19 ENCOUNTER — Ambulatory Visit (INDEPENDENT_AMBULATORY_CARE_PROVIDER_SITE_OTHER): Payer: BC Managed Care – PPO | Admitting: Obstetrics and Gynecology

## 2018-11-19 ENCOUNTER — Encounter: Payer: Self-pay | Admitting: Obstetrics and Gynecology

## 2018-11-19 ENCOUNTER — Other Ambulatory Visit: Payer: Self-pay

## 2018-11-19 ENCOUNTER — Other Ambulatory Visit (HOSPITAL_COMMUNITY)
Admission: RE | Admit: 2018-11-19 | Discharge: 2018-11-19 | Disposition: A | Discharge status: Home / Self Care | Payer: BC Managed Care – PPO | Source: Ambulatory Visit | Attending: Obstetrics and Gynecology | Admitting: Obstetrics and Gynecology

## 2018-11-19 VITALS — BP 104/62 | Ht 65.0 in | Wt 146.0 lb

## 2018-11-19 DIAGNOSIS — N760 Acute vaginitis: Secondary | ICD-10-CM | POA: Diagnosis present

## 2018-11-19 DIAGNOSIS — Z124 Encounter for screening for malignant neoplasm of cervix: Secondary | ICD-10-CM | POA: Insufficient documentation

## 2018-11-19 DIAGNOSIS — Z113 Encounter for screening for infections with a predominantly sexual mode of transmission: Secondary | ICD-10-CM | POA: Insufficient documentation

## 2018-11-19 MED ORDER — CLINDAMYCIN PHOSPHATE 100 MG VA SUPP: 100.0000 mg | Freq: Every day | VAGINAL | 1 refills | Status: AC

## 2018-11-19 NOTE — Progress Notes (Signed)
Obstetrics & Gynecology Office Visit   Chief Complaint:  Chief Complaint  Patient presents with  . Follow-up    repeat pap, BV    History of Present Illness:Loretta Warner is a 28 y.o. woman who presents today for continued surveillance for history of dysplasia. Last pap obtained on 10/30/2017 revealed ASCUS HPV positive.  Prior colposcopy no CIN and Negative ECC    Ms. PENNYE BEEGHLY is a 28 y.o. G0P0000 who LMP was Patient's last menstrual period was 11/05/2018 (exact date)., presents today for a problem visit.   Patient complains of an abnormal vaginal discharge and odor. This has been a chronic issue for the patient.  She feels it became more prominent after her 5 years of depo provera use. Contraception: Ortho-Evra patches weekly.  She denies recent antibiotic exposure, denies changes in soaps, detergents coinciding with the onset of her symptoms.  She has previously self treated or been under treatment by another provider for these symptoms.   Pap/Treatment History:  10/30/2017 ASCUS HPV positive 07/31/2012 NIL   Review of Systems: Review of Systems  Constitutional: Negative.   Genitourinary: Negative.   Skin: Negative.     Past Medical History:  No past medical history on file.  Past Surgical History:  Past Surgical History:  Procedure Laterality Date  . TONSILLECTOMY      Gynecologic History: Patient's last menstrual period was 11/05/2018 (exact date).  Obstetric History: G0P0000  Family History:  No family history on file.  Social History:  Social History   Socioeconomic History  . Marital status: Single    Spouse name: Not on file  . Number of children: Not on file  . Years of education: Not on file  . Highest education level: Not on file  Occupational History  . Not on file  Social Needs  . Financial resource strain: Not on file  . Food insecurity    Worry: Not on file    Inability: Not on file  . Transportation needs    Medical:  Not on file    Non-medical: Not on file  Tobacco Use  . Smoking status: Current Some Day Smoker    Types: Cigarettes  . Smokeless tobacco: Never Used  . Tobacco comment: black and milds  Substance and Sexual Activity  . Alcohol use: No    Frequency: Never  . Drug use: Yes    Types: Marijuana  . Sexual activity: Yes    Birth control/protection: Patch  Lifestyle  . Physical activity    Days per week: Not on file    Minutes per session: Not on file  . Stress: Not on file  Relationships  . Social Herbalist on phone: Not on file    Gets together: Not on file    Attends religious service: Not on file    Active member of club or organization: Not on file    Attends meetings of clubs or organizations: Not on file    Relationship status: Not on file  . Intimate partner violence    Fear of current or ex partner: Not on file    Emotionally abused: Not on file    Physically abused: Not on file    Forced sexual activity: Not on file  Other Topics Concern  . Not on file  Social History Narrative  . Not on file    Allergies:  Allergies  Allergen Reactions  . Macadamia Nut Oil Anaphylaxis    Medications:  Prior to Admission medications   Medication Sig Start Date End Date Taking? Authorizing Provider  clindamycin-benzoyl peroxide (BENZACLIN) gel SPOT TREAT AS DIRECTED QAM 09/04/18  Yes [provider]  tretinoin (RETIN-A) 0.025 % cream APLY A PEA SIZED AMOUNT TO ENTIRE FACE AT NIGHT TIME 09/07/18  Yes [provider]  Burr MedicoXULANE 150-35 MCG/24HR transdermal patch  11/16/18  Yes [provider]    Physical Exam Vitals:  Vitals:   11/19/18 0834  BP: 104/62   Patient's last menstrual period was 11/05/2018 (exact date).  General: NAD HEENT: normocephalic, anicteric Thyroid: no enlargement, no palpable nodules Pulmonary: No increased work of breathing Genitourinary:  External: Normal external female genitalia.  Normal urethral meatus, normal   Bartholin's and Skene's glands.    Vagina: Normal vaginal mucosa, no evidence of prolapse.    Cervix: Grossly normal in appearance, no bleeding  Uterus: Non-enlarged, mobile, normal contour.  No CMT  Adnexa: ovaries non-enlarged, no adnexal masses  Rectal: deferred  Lymphatic: no evidence of inguinal lymphadenopathy Extremities: no edema, erythema, or tenderness Neurologic: Grossly intact Psychiatric: mood appropriate, affect full  Female chaperone present for pelvic and breast  portions of the physical exam      Assessment: [age] y.o. G0P0000 follow up for follow up pap recurrent vaginitis  Plan: Problem List Items Addressed This Visit    None    Visit Diagnoses    Screening for malignant neoplasm of cervix    -  Primary   Relevant Orders   Cytology - PAP   Routine screening for STI (sexually transmitted infection)       Relevant Orders   Cytology - PAP   Recurrent vaginitis       Relevant Orders   Cytology - PAP      - Follow up pap smear from today.   - I had a lengthly discussion with Quintella BatonSandrika L Zumbro  regarding the cause of dysplasia of the lower genital tract (including immunosuppression in the setting of HPV exposure and tobacco exposure). I explained the potential for progression to invasive malignancy, the recurrent nature of these lesions (and the need for close continued followup). Results of today's pap will dictate need for further evaluation and follow up per ASCCP guidelines..  - She is comfortable with the plan and had her questions answered.  - Risk factors for bacterial vaginosis and candida infections discussed.  We discussed normal vaginal flora/microbiome.  Any factors that may alter the microbiome increase the risk of these opportunistic infections.  These include changes in pH, antibiotic exposures, diabetes, wet bathing suits etc.  We discussed that treatment is aimed at eradicating abnormal bacterial overgrowth and or yeast.  There may be some role  for vaginal probiotics in restoring normal vaginal flora.    - Return in about 1 year (around 11/19/2019) for annual.   Vena AustriaAndreas Robbi Spells, MD, Merlinda FrederickFACOG [facility], [facility] 11/19/2018, 9:04 AM

## 2018-11-19 NOTE — Patient Instructions (Addendum)
RePhresh Pro-B 

## 2018-11-24 LAB — CYTOLOGY - PAP
Chlamydia: NEGATIVE
Diagnosis: UNDETERMINED — AB
HPV: DETECTED — AB
Neisseria Gonorrhea: NEGATIVE
Trichomonas: NEGATIVE

## 2018-11-25 NOTE — Progress Notes (Signed)
Need colposcopy next 1-4 weeks

## 2018-11-26 ENCOUNTER — Other Ambulatory Visit: Payer: Self-pay | Admitting: Obstetrics and Gynecology

## 2018-11-26 MED ORDER — FLUCONAZOLE 150 MG PO TABS: 150.0000 mg | ORAL_TABLET | Freq: Once | ORAL | 0 refills | Status: AC

## 2018-11-26 NOTE — Telephone Encounter (Signed)
Pt calling for rx AMS was going to call in.  [phone]  Mailbox full.

## 2018-12-01 ENCOUNTER — Other Ambulatory Visit: Payer: Self-pay | Admitting: Obstetrics and Gynecology

## 2018-12-01 MED ORDER — VALACYCLOVIR HCL 1 G PO TABS: 1000.0000 mg | ORAL_TABLET | Freq: Every day | ORAL | 11 refills | Status: DC

## 2018-12-14 ENCOUNTER — Ambulatory Visit (INDEPENDENT_AMBULATORY_CARE_PROVIDER_SITE_OTHER): Payer: BC Managed Care – PPO | Admitting: Obstetrics and Gynecology

## 2018-12-14 ENCOUNTER — Encounter: Payer: Self-pay | Admitting: Obstetrics and Gynecology

## 2018-12-14 ENCOUNTER — Other Ambulatory Visit: Payer: Self-pay

## 2018-12-14 ENCOUNTER — Other Ambulatory Visit (HOSPITAL_COMMUNITY)
Admission: RE | Admit: 2018-12-14 | Discharge: 2018-12-14 | Disposition: A | Discharge status: Home / Self Care | Payer: BC Managed Care – PPO | Source: Ambulatory Visit | Attending: Obstetrics and Gynecology | Admitting: Obstetrics and Gynecology

## 2018-12-14 VITALS — BP 110/68 | Wt 145.0 lb

## 2018-12-14 DIAGNOSIS — R87610 Atypical squamous cells of undetermined significance on cytologic smear of cervix (ASC-US): Secondary | ICD-10-CM | POA: Insufficient documentation

## 2018-12-14 DIAGNOSIS — R87810 Cervical high risk human papillomavirus (HPV) DNA test positive: Secondary | ICD-10-CM | POA: Insufficient documentation

## 2018-12-14 NOTE — Addendum Note (Signed)
Addended by: Dorthula Nettles on: 12/14/2018 07:48 PM   Modules accepted: Level of Service

## 2018-12-14 NOTE — Progress Notes (Signed)
   GYNECOLOGY CLINIC COLPOSCOPY PROCEDURE NOTE  28 y.o. G0P0000 here for colposcopy for ASCUS with POSITIVE high risk HPV  pap smear on 11/19/2018. Discussed underlying role for HPV infection in the development of cervical dysplasia, its natural history and progression/regression, need for surveillance.  Is the patient  pregnant: No LMP: Patient's last menstrual period was 12/03/2018. Smoking status:  reports that she has been smoking cigarettes. She has never used smokeless tobacco.  Patient given informed consent, signed copy in the chart, time out was performed.  The patient was position in dorsal lithotomy position. Speculum was placed the cervix was visualized.   After application of acetic acid colposcopic inspection of the cervix was undertaken.   Colposcopy adequate, full visualization of transformation zone: Yes no visible lesions; corresponding biopsies obtained.   ECC specimen obtained:  Yes  All specimens were labeled and sent to pathology.   Patient was given post procedure instructions.  Will follow up pathology and manage accordingly.  Routine preventative health maintenance measures emphasized.  OBGyn Exam  Malachy Mood, MD, [facility], [facility]

## 2018-12-16 ENCOUNTER — Other Ambulatory Visit: Payer: Self-pay

## 2018-12-17 ENCOUNTER — Telehealth: Payer: Self-pay

## 2018-12-17 ENCOUNTER — Other Ambulatory Visit: Payer: Self-pay | Admitting: Obstetrics and Gynecology

## 2018-12-17 LAB — SURGICAL PATHOLOGY

## 2018-12-17 MED ORDER — FLUCONAZOLE 150 MG PO TABS: 150.0000 mg | ORAL_TABLET | Freq: Once | ORAL | 0 refills | Status: AC

## 2018-12-17 NOTE — Telephone Encounter (Signed)
Pt called triage line stating she was here on Monday 9/14 for a Colpo. She needs a note for work. Also, she has a yeast infection. She was wondering if she could get a Rx called in?   I wrote the note and sent to patient. She is aware you are on call and I told her she would not need to be seen that we could call in a Rx for her.

## 2018-12-17 NOTE — Telephone Encounter (Signed)
Pt aware.

## 2018-12-17 NOTE — Telephone Encounter (Signed)
Thanks Maudie Mercury, Rx for diflucan has been sent to her pharmacy

## 2018-12-21 ENCOUNTER — Encounter: Payer: Self-pay | Admitting: Obstetrics and Gynecology

## 2019-03-24 ENCOUNTER — Other Ambulatory Visit: Payer: Self-pay | Admitting: Physician Assistant

## 2019-03-24 ENCOUNTER — Encounter: Payer: Self-pay | Admitting: Physician Assistant

## 2019-03-24 LAB — HM PAP SMEAR

## 2019-03-24 NOTE — Telephone Encounter (Signed)
Patient with last RP 10/2017.  Refill oked due to COVID-19 in July of 2020.  Refills oked again with note to pharmacy that patient needs to have in person visit prior to more refills.

## 2019-07-03 ENCOUNTER — Other Ambulatory Visit: Payer: Self-pay | Admitting: Obstetrics and Gynecology

## 2019-07-04 ENCOUNTER — Other Ambulatory Visit: Payer: Self-pay | Admitting: Obstetrics and Gynecology

## 2019-07-05 ENCOUNTER — Other Ambulatory Visit: Payer: Self-pay | Admitting: Obstetrics and Gynecology

## 2019-07-06 ENCOUNTER — Other Ambulatory Visit: Payer: Self-pay | Admitting: Obstetrics and Gynecology

## 2019-07-07 ENCOUNTER — Other Ambulatory Visit: Payer: Self-pay | Admitting: Obstetrics and Gynecology

## 2019-09-15 ENCOUNTER — Encounter: Payer: Self-pay | Admitting: Physician Assistant

## 2019-09-15 NOTE — Progress Notes (Signed)
Received faxed refill request from [facility] for patient.  Per chart review, patient with last RP 10/2017.  Had refill OKed on 10/22/2018 for 3 month supply and again on 03/24/2019 refill oked for 3 month supply.  Form completed to not approve more refills since with last refill note sent that patient needs visit in clinic prior to more refills.  Note on form stating denied refills since patient needs an in-person visit prior to more refills.  Faxed form and confirmation received from pharmacy.

## 2019-12-23 ENCOUNTER — Other Ambulatory Visit: Payer: Self-pay | Admitting: Physician Assistant

## 2019-12-27 ENCOUNTER — Other Ambulatory Visit: Payer: Self-pay

## 2019-12-27 ENCOUNTER — Telehealth: Payer: Self-pay | Admitting: Obstetrics and Gynecology

## 2019-12-27 MED ORDER — XULANE 150-35 MCG/24HR TD PTWK: MEDICATED_PATCH | TRANSDERMAL | 0 refills | Status: DC

## 2019-12-27 NOTE — Telephone Encounter (Signed)
Refill sent.

## 2019-12-27 NOTE — Telephone Encounter (Signed)
Patient is needing a refill on her Zulon pacth(birth control). Pt is scheduled for her annutal on 01/17/20 with AMS. Would like to have meds called to [facility]  CB# [phone]

## 2020-01-17 ENCOUNTER — Other Ambulatory Visit (HOSPITAL_COMMUNITY)
Admission: RE | Admit: 2020-01-17 | Discharge: 2020-01-17 | Disposition: A | Discharge status: Home / Self Care | Payer: BC Managed Care – PPO | Source: Ambulatory Visit | Attending: Obstetrics and Gynecology | Admitting: Obstetrics and Gynecology

## 2020-01-17 ENCOUNTER — Ambulatory Visit (INDEPENDENT_AMBULATORY_CARE_PROVIDER_SITE_OTHER): Payer: BC Managed Care – PPO | Admitting: Obstetrics and Gynecology

## 2020-01-17 ENCOUNTER — Other Ambulatory Visit: Payer: Self-pay

## 2020-01-17 VITALS — BP 116/72 | Ht 65.0 in | Wt 150.0 lb

## 2020-01-17 DIAGNOSIS — Z124 Encounter for screening for malignant neoplasm of cervix: Secondary | ICD-10-CM | POA: Diagnosis present

## 2020-01-17 DIAGNOSIS — Z01419 Encounter for gynecological examination (general) (routine) without abnormal findings: Secondary | ICD-10-CM

## 2020-01-17 DIAGNOSIS — Z113 Encounter for screening for infections with a predominantly sexual mode of transmission: Secondary | ICD-10-CM | POA: Insufficient documentation

## 2020-01-17 DIAGNOSIS — Z3045 Encounter for surveillance of transdermal patch hormonal contraceptive device: Secondary | ICD-10-CM

## 2020-01-17 DIAGNOSIS — Z1239 Encounter for other screening for malignant neoplasm of breast: Secondary | ICD-10-CM

## 2020-01-17 MED ORDER — XULANE 150-35 MCG/24HR TD PTWK
MEDICATED_PATCH | TRANSDERMAL | 3 refills | Status: DC
Start: 2020-01-17 — End: 2021-01-04

## 2020-01-17 NOTE — Progress Notes (Signed)
Gynecology Annual Exam   PCP: Federico Flake, MD  Chief Complaint:  Chief Complaint  Patient presents with  . Gynecologic Exam    History of Present Illness: Patient is a 29 y.o. G0P0000 presents for annual exam. The patient has no complaints today.   LMP: Patient's last menstrual period was 01/04/2020. Average Interval: regular, 28 days Duration of flow: 5 days Heavy Menses: no Clots: no Intermenstrual Bleeding: no Postcoital Bleeding: no Dysmenorrhea: no  The patient is sexually active. She currently uses Xulane patch for contraception. She denies dyspareunia.  The patient does perform self breast exams.  There is no notable family history of breast or ovarian cancer in her family.  The patient wears seatbelts: yes.   The patient has regular exercise: not asked.    The patient denies current symptoms of depression.    Review of Systems: Review of Systems  Constitutional: Negative for chills and fever.  HENT: Negative for congestion.   Respiratory: Negative for cough and shortness of breath.   Cardiovascular: Negative for chest pain and palpitations.  Gastrointestinal: Negative for abdominal pain, constipation, diarrhea, heartburn, nausea and vomiting.  Genitourinary: Negative for dysuria, frequency and urgency.  Skin: Negative for itching and rash.  Neurological: Negative for dizziness and headaches.  Endo/Heme/Allergies: Negative for polydipsia.  Psychiatric/Behavioral: Negative for depression.    Past Medical History:  There are no problems to display for this patient.   Past Surgical History:  Past Surgical History:  Procedure Laterality Date  . TONSILLECTOMY      Gynecologic History:  Patient's last menstrual period was 01/04/2020. 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  Social  History:  Social History   Socioeconomic History  . Marital status: Single    Spouse name: Not on file  . Number of children: Not on file  . Years of education: Not on file  . Highest education level: Not on file  Occupational History  . Not on file  Tobacco Use  . Smoking status: Current Some Day Smoker    Types: Cigarettes  . Smokeless tobacco: Never Used  . Tobacco comment: black and milds  Vaping Use  . Vaping Use: Never used  Substance and Sexual Activity  . Alcohol use: No  . Drug use: Yes    Types: Marijuana  . Sexual activity: Yes    Birth control/protection: Patch  Other Topics Concern  . Not on file  Social History Narrative  . Not on file   Social Determinants of Health   Financial Resource Strain:   . Difficulty of Paying Living Expenses: Not on file  Food Insecurity:   . Worried About Programme researcher, broadcasting/film/video in the Last Year: Not on file  . Ran Out of Food in the Last Year: Not on file  Transportation Needs:   . Lack of Transportation (Medical): Not on file  . Lack of Transportation (Non-Medical): Not on file  Physical Activity:   . Days of Exercise per Week: Not on file  . Minutes of Exercise per Session: Not on file  Stress:   . Feeling of Stress : Not on file  Social Connections:   . Frequency of Communication with Friends and Family: Not on file  . Frequency of Social Gatherings with Friends and Family: Not on file  . Attends Religious Services: Not on file  .  Active Member of Clubs or Organizations: Not on file  . Attends Banker Meetings: Not on file  . Marital Status: Not on file  Intimate Partner Violence:   . Fear of Current or Ex-Partner: Not on file  . Emotionally Abused: Not on file  . Physically Abused: Not on file  . Sexually Abused: Not on file    Allergies:  Allergies  Allergen Reactions  . Macadamia Nut Oil Anaphylaxis    Medications: Prior to Admission medications   Medication Sig Start Date End Date Taking?  Authorizing Provider  clindamycin-benzoyl peroxide (BENZACLIN) gel SPOT TREAT AS DIRECTED QAM 09/04/18  Yes [provider]  norelgestromin-ethinyl estradiol [employer]) 150-35 MCG/24HR transdermal patch APPLY 1 PARCH EVERY WEEK FOR 3 WEEKS AND 1 WEEK FREE OF PATCH, THEN REPEAT AS DIRECTED 12/27/19  Yes Vena Austria, MD  tretinoin (RETIN-A) 0.025 % cream APLY A PEA SIZED AMOUNT TO ENTIRE FACE AT NIGHT TIME 09/07/18  Yes [provider]  valACYclovir (VALTREX) 1000 MG tablet Take 1 tablet (1,000 mg total) by mouth daily. 12/01/18  Yes Vena Austria, MD    Physical Exam Vitals: Blood pressure 116/72, height 5\' 5"  (1.651 m), weight 150 lb (68 kg), last menstrual period 01/04/2020.  General: NAD HEENT: normocephalic, anicteric Thyroid: no enlargement, no palpable nodules Pulmonary: No increased work of breathing, CTAB Cardiovascular: RRR, distal pulses 2+ Breast: Breast symmetrical, no tenderness, no palpable nodules or masses, no skin or nipple retraction present, no nipple discharge.  No axillary or supraclavicular lymphadenopathy. Abdomen: NABS, soft, non-tender, non-distended.  Umbilicus without lesions.  No hepatomegaly, splenomegaly or masses palpable. No evidence of hernia  Genitourinary:  External: Normal external female genitalia.  Normal urethral meatus, normal Bartholin's and Skene's glands.    Vagina: Normal vaginal mucosa, no evidence of prolapse.    Cervix: Grossly normal in appearance, no bleeding  Uterus: Non-enlarged, mobile, normal contour.  No CMT  Adnexa: ovaries non-enlarged, no adnexal masses  Rectal: deferred  Lymphatic: no evidence of inguinal lymphadenopathy Extremities: no edema, erythema, or tenderness Neurologic: Grossly intact Psychiatric: mood appropriate, affect full  Female chaperone present for pelvic and breast  portions of the physical exam   There is no immunization history on file for this patient.   Assessment: 29 y.o. G0P0000  routine annual exam  Plan: Problem List Items Addressed This Visit    None    Visit Diagnoses    Screening for malignant neoplasm of cervix    -  Primary   Relevant Orders   Cytology - PAP   Encounter for gynecological examination without abnormal finding       Breast screening       Encounter for surveillance of transdermal patch hormonal contraceptive device       Routine screening for STI (sexually transmitted infection)       Relevant Orders   Cytology - PAP   HEP, RPR, HIV Panel      1) STI screening  wasoffered and accepted  2)  ASCCP guidelines and rational discussed.  Patient opts for every 3 years screening interval  - Follow up ASCUS HPV positive 12/16/2018  3) Contraception - the patient is currently using  Xulane patch.  She is happy with her current form of contraception and plans to continue  4) Routine healthcare maintenance including cholesterol, diabetes screening discussed managed by PCP  5) No follow-ups on file.   12/18/2018, MD, Vena Austria [facility], [facility] 01/17/2020, 8:46 AM

## 2020-01-19 LAB — CYTOLOGY - PAP
Chlamydia: NEGATIVE
Comment: NEGATIVE
Comment: NORMAL
Diagnosis: HIGH — AB
Neisseria Gonorrhea: NEGATIVE

## 2020-01-21 ENCOUNTER — Telehealth: Payer: Self-pay | Admitting: Obstetrics and Gynecology

## 2020-01-21 NOTE — Telephone Encounter (Signed)
Patient is schedule for 03/03/20 with AMS for Colpo

## 2020-01-21 NOTE — Progress Notes (Signed)
Follow up colposcopy next 1-3 weeks

## 2020-01-21 NOTE — Telephone Encounter (Signed)
-----   Message from Vena Austria, MD sent at 01/21/2020 11:36 AM EDT ----- Follow up colposcopy next 1-3 weeks

## 2020-03-03 ENCOUNTER — Other Ambulatory Visit (HOSPITAL_COMMUNITY)
Admission: RE | Admit: 2020-03-03 | Discharge: 2020-03-03 | Disposition: A | Discharge status: Home / Self Care | Payer: BC Managed Care – PPO | Source: Ambulatory Visit | Attending: Obstetrics and Gynecology | Admitting: Obstetrics and Gynecology

## 2020-03-03 ENCOUNTER — Encounter: Payer: Self-pay | Admitting: Obstetrics and Gynecology

## 2020-03-03 ENCOUNTER — Other Ambulatory Visit: Payer: Self-pay

## 2020-03-03 ENCOUNTER — Ambulatory Visit (INDEPENDENT_AMBULATORY_CARE_PROVIDER_SITE_OTHER): Payer: BC Managed Care – PPO | Admitting: Obstetrics and Gynecology

## 2020-03-03 VITALS — BP 110/62 | Ht 65.0 in | Wt 148.0 lb

## 2020-03-03 DIAGNOSIS — R87613 High grade squamous intraepithelial lesion on cytologic smear of cervix (HGSIL): Secondary | ICD-10-CM

## 2020-03-03 NOTE — Progress Notes (Signed)
Procedure RM

## 2020-03-03 NOTE — Progress Notes (Signed)
Obstetrics & Gynecology Office Visit   Chief Complaint:  Chief Complaint  Patient presents with  . Colposcopy    Procedure RM    History of Present Illness:Loretta Warner is a 29 y.o. woman who presents today for continued surveillance for history of dysplasia. Last pap obtained on 01/17/2020 revealed HGSIL.  Prior colposcopy 12/15/2018 CIN I ectocervix.    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   Past Surgical History:  There are no problems to display for this patient.   Gynecologic History: Patient's last menstrual period was 02/02/2020.  Obstetric History: G0P0000  Family History:  History reviewed. No pertinent family history.  Social History:  Social History   Socioeconomic History  . Marital status: Single    Spouse name: Not on file  . Number of children: Not on file  . Years of education: Not on file  . Highest education level: Not on file  Occupational History  . Not on file  Tobacco Use  . Smoking status: Current Some Day Smoker    Types: Cigarettes  . Smokeless tobacco: Never Used  . Tobacco comment: black and milds  Vaping Use  . Vaping Use: Never used  Substance and Sexual Activity  . Alcohol use: No  . Drug use: Yes    Types: Marijuana  . Sexual activity: Yes    Birth control/protection: Patch  Other Topics Concern  . Not on file  Social History Narrative  . Not on file   Social Determinants of Health   Financial Resource Strain:   . Difficulty of Paying Living Expenses: Not on file  Food Insecurity:   . Worried About Programme researcher, broadcasting/film/video in the Last Year: Not on file  . Ran Out of Food in the Last Year: Not on file  Transportation Needs:   . Lack of Transportation (Medical): Not on file  . Lack of Transportation  (Non-Medical): Not on file  Physical Activity:   . Days of Exercise per Week: Not on file  . Minutes of Exercise per Session: Not on file  Stress:   . Feeling of Stress : Not on file  Social Connections:   . Frequency of Communication with Friends and Family: Not on file  . Frequency of Social Gatherings with Friends and Family: Not on file  . Attends Religious Services: Not on file  . Active Member of Clubs or Organizations: Not on file  . Attends Banker Meetings: Not on file  . Marital Status: Not on file  Intimate Partner Violence:   . Fear of Current or Ex-Partner: Not on file  . Emotionally Abused: Not on file  . Physically Abused: Not on file  . Sexually Abused: Not on file    Allergies:  Allergies  Allergen Reactions  . Macadamia Nut Oil Anaphylaxis    Medications: Prior to Admission medications   Medication Sig Start Date End Date Taking? Authorizing Provider  clindamycin-benzoyl peroxide (BENZACLIN) gel SPOT TREAT AS DIRECTED QAM 09/04/18  Yes [provider]  norelgestromin-ethinyl estradiol [employer]) 150-35 MCG/24HR transdermal patch APPLY 1 PARCH EVERY WEEK FOR 3 WEEKS AND 1 WEEK FREE OF PATCH, THEN REPEAT AS DIRECTED 01/17/20  Yes Vena Austria, MD  tretinoin (RETIN-A) 0.025 % cream APLY A PEA SIZED AMOUNT TO ENTIRE  FACE AT NIGHT TIME 09/07/18  Yes [provider]  valACYclovir (VALTREX) 1000 MG tablet Take 1 tablet (1,000 mg total) by mouth daily. Patient not taking: Reported on 03/03/2020 12/01/18   Vena Austria, MD    Physical Exam Vitals:  Vitals:   03/03/20 1025  BP: 110/62   Patient's last menstrual period was 02/02/2020.  General: NAD HEENT: normocephalic, anicteric Pulmonary: No increased work of breathing Genitourinary:  External: Normal external female genitalia.  Normal urethral meatus, normal  Bartholin's and Skene's glands.    Vagina: Normal vaginal mucosa, no evidence of prolapse.    Cervix: Grossly normal  in appearance, no bleeding  Extremities: no edema, erythema, or tenderness Neurologic: Grossly intact Psychiatric: mood appropriate, affect full  Female chaperone present for pelvic and breast  portions of the physical exam   GYNECOLOGY CLINIC COLPOSCOPY PROCEDURE NOTE  29 y.o. G0P0000 here for colposcopy for HSIL pap smear on 01/17/2020. Discussed underlying role for HPV infection in the development of cervical dysplasia, its natural history and progression/regression, need for surveillance.  Is the patient  pregnant: No LMP: Patient's last menstrual period was 02/02/2020. Smoking status:  reports that she has been smoking cigarettes. She has never used smokeless tobacco. Contraception: Nexplanon and Xulane patch  Patient given informed consent, signed copy in the chart, time out was performed.  The patient was position in dorsal lithotomy position. Speculum was placed the cervix was visualized.   After application of acetic acid colposcopic inspection of the cervix was undertaken.   Colposcopy adequate, full visualization of transformation zone: Yes no visible lesions; random 12 and 6 O'Clock biopsies obtained.   ECC specimen obtained:  Yes  All specimens were labeled and sent to pathology.   Patient was given post procedure instructions.  Will follow up pathology and manage accordingly.  Routine preventative health maintenance measures emphasized.  OBGyn Exam  Vena Austria, MD, FACOG [facility], [facility]    Assessment: 29 y.o. G0P0000 follow up for HSIL pap  Plan: Problem List Items Addressed This Visit    None    Visit Diagnoses    HSIL (high grade squamous intraepithelial lesion) on Pap smear of cervix    -  Primary      - Follow up colposcopy today.   - I had a lengthly discussion with Loretta Warner  regarding the cause of dysplasia of the lower genital tract (including immunosuppression in the setting of HPV exposure and tobacco  exposure). I explained the potential for progression to invasive malignancy, the recurrent nature of these lesions (and the need for close continued followup). Results of today's pathology will dictate need for further evaluation and follow up per ASCCP guidelines..  - She is comfortable with the plan and had her questions answered.  - Return in about 1 year (around 03/03/2021) for annual.   Vena Austria, MD, [facility], [facility] 03/03/2020, 10:56 AM

## 2020-03-06 ENCOUNTER — Encounter: Payer: Self-pay | Admitting: Obstetrics and Gynecology

## 2020-03-06 DIAGNOSIS — D069 Carcinoma in situ of cervix, unspecified: Secondary | ICD-10-CM | POA: Insufficient documentation

## 2020-03-06 LAB — SURGICAL PATHOLOGY

## 2020-03-06 NOTE — Progress Notes (Signed)
LEEP in 2-6 weeks also sent surgery request, this will be in office

## 2020-03-10 ENCOUNTER — Telehealth: Payer: Self-pay | Admitting: Obstetrics and Gynecology

## 2020-03-10 NOTE — Telephone Encounter (Signed)
-----   Message from Vena Austria, MD sent at 03/06/2020  1:20 PM EST ----- Surgery Booking Request Patient Full Name:  Loretta Warner  MRN: [id number]  DOB: June 12, 1990  Surgeon: Vena Austria, MD  Requested Surgery Date and Time: 2-4 weeks  Primary Diagnosis AND Code: CIN III Secondary Diagnosis and Code:  Surgical Procedure: LEEP No H&P needed in office

## 2020-03-10 NOTE — Telephone Encounter (Signed)
Called pt to schedule in office LEEP w [facility]  DOS 04/04/20 @ 9:30

## 2020-03-10 NOTE — Telephone Encounter (Signed)
Called pt to sch LEEP - v/m is full, could not leave msg

## 2020-03-13 NOTE — Telephone Encounter (Signed)
Noted. CMA aware. 

## 2020-04-04 ENCOUNTER — Ambulatory Visit: Payer: BC Managed Care – PPO | Admitting: Obstetrics and Gynecology

## 2020-04-28 ENCOUNTER — Other Ambulatory Visit (HOSPITAL_COMMUNITY)
Admission: RE | Admit: 2020-04-28 | Discharge: 2020-04-28 | Disposition: A | Discharge status: Home / Self Care | Payer: BC Managed Care – PPO | Source: Ambulatory Visit | Attending: Obstetrics and Gynecology | Admitting: Obstetrics and Gynecology

## 2020-04-28 ENCOUNTER — Ambulatory Visit: Payer: BC Managed Care – PPO | Admitting: Obstetrics and Gynecology

## 2020-04-28 ENCOUNTER — Ambulatory Visit (INDEPENDENT_AMBULATORY_CARE_PROVIDER_SITE_OTHER): Payer: BC Managed Care – PPO | Admitting: Obstetrics and Gynecology

## 2020-04-28 ENCOUNTER — Encounter: Payer: Self-pay | Admitting: Obstetrics and Gynecology

## 2020-04-28 ENCOUNTER — Other Ambulatory Visit: Payer: Self-pay

## 2020-04-28 VITALS — BP 120/68 | Ht 65.0 in | Wt 147.0 lb

## 2020-04-28 DIAGNOSIS — D069 Carcinoma in situ of cervix, unspecified: Secondary | ICD-10-CM | POA: Diagnosis not present

## 2020-04-28 NOTE — Progress Notes (Signed)
   LEEP PROCEDURE NOTE  The LEEP has been explained to the patient in detail; risks/benefits reviewed.  The risks include, but are not limited to, bleeding, infection, and the possibility of cervical stenosis or cervical incompetence.  The patient had previously been given information regarding abnormal PAP smears and their relationship to HPV.  We have discussed the natural course and history of HPV, the possibility of incomplete treatment by the LEEP, as well as the possibility of recurrence.  I have reviewed the consent form for LEEP with her, and she fully understands its contents.  We have discussed the procedure itself. I have informed her that following the LEEP she should refrain from intercourse and the use of tampons for three weeks, and that she should also expect some spotting and brown/black discharge over the next several days.  We have discussed the fact that vaginal bleeding, differentiated from spotting, is not normal and that if she should have this complication, she should contact me immediately.  The follow-up after LEEP will be PAP smears or viral typing performed at regular intervals for up to 3-5 years.  Should these all prove to be normal, she will then be back on typical cervical screening.  I have answered all of her questions, and I believe she has an adequate understanding of the LEEP, its implications, and the necessity of follow-up care.  I discussed her colpo results and explained the procedure of LEEP.  All questions were answered and she signed the consent form.    LEEP performed in the usual manner after reviewing the previous colpo findings and results. Acetic acid solution was usesd to identify any abnormal areas of the cervix.  The cervix was cleansed with betadine solution. Local injection of Lidocaine was performed for anesthesia. Ectocervical and then endocervical specimens obtained using the loop electrodes without difficulty.  It was labeled accordingly. The  base and edges of the defect was then cauterized using coagulation current.  Vena Austria, MD 04/28/2020 10:30 AM

## 2020-05-01 LAB — SURGICAL PATHOLOGY

## 2020-06-08 NOTE — Progress Notes (Signed)
Postoperative Follow-up Patient presents post op from LEEP 6weeks ago for CIN III.  Subjective: Patient reports marked improvement in her preop symptoms. Eating a regular diet without difficulty. The patient is not having any pain.  Activity: normal activities of daily living.  Objective: Vitals: Blood pressure 110/72, weight 145 lb (65.8 kg), last menstrual period 05/31/2020.  General: NAD Pulmonary: no increased work of breathing Abdomen: soft, non-tender, non-distended, incision(s) D/C/I GU: normal external female genitalia normal cervix, no CMT, uterus normal in shape and contour, no adnexal tenderness or masses Extremities: no edema Neurologic: normal gait    Procedure visit on 04/28/2020  Component Date Value Ref Range Status  . SURGICAL PATHOLOGY 04/28/2020    Final-Edited                   Value:SURGICAL PATHOLOGY CASE: [id number] PATIENT: Loretta Warner Surgical Pathology Report     Clinical History: CIN 3 (cm)     FINAL MICROSCOPIC DIAGNOSIS:  A. ECTOCERVIX, LEEP: - High-grade squamous intraepithelial lesion, CIN-2 and focal CIN-3 (moderate and focal severe dysplasia). - Margins not involved.  B. ENDOCERVIX, LEEP: - Benign endocervical mucosa. - No dysplasia or malignancy.  C. ENDOCERVIX, CURETTAGE: - Benign endocervical mucosa.  GROSS DESCRIPTION: A: Received in formalin is a 1.5 x 1.5 x 0.8 cm intact cervical cone excision which is not clinically oriented.  There is a distinct mucosal junction.  The ectocervical mucosa is smooth and tan.  The endocervical mucosa is glistening, tan-pink and partially covered by mucus.  The margins are inked with the ectocervical black and endocervical yellow. The specimen is sectioned and entirely submitted in 4 cassettes.  B: Received in formalin is a 1.2 x 1 x 0.5 cm portion of rubbery tan-pink t                         issue.  A distinct mucosal surface is not identified and the specimen is  inked black.  The specimen is sectioned and entirely submitted in 2 cassettes.  C: Received in formalin is blood tinged mucus that is entirely submitted in one block.  Volume: 0.4 x 0.4 x 0.2 cm (GRP 04/29/2020)  Final Diagnosis performed by Jimmy Picket, MD.   Electronically signed 05/01/2020 Technical component performed at [facility]. [facility], [address], [place], [place] [address].  Professional component performed at [facility], [address], [place], [place] [address].  Immunohistochemistry Technical component (if applicable) was performed at [facility]. [address], [place], [place] [address].   IMMUNOHISTOCHEMISTRY DISCLAIMER (if applicable): Some of these immunohistochemical stains may have been developed and the performance characteristics determine by [facility]. Some may not have been cleared or approved                          by the U.S. Food and Drug Administration. The FDA has determined that such clearance or approval is not necessary. This test is used for clinical purposes. It should not be regarded as investigational or for research. This laboratory is certified under the Clinical Laboratory Improvement Amendments of 1988 (CLIA-88) as qualified to perform high complexity clinical laboratory testing.  The controls stained appropriately.      Assessment: 30 y.o. s/p LEEP stable  Plan: Patient has done well after surgery with no apparent complications.  I have discussed the post-operative course to date, and  the expected progress moving forward.  The patient understands what complications to be concerned about.  I will see the patient in routine follow up, or sooner if needed.    Activity plan: No restriction.  Follow up pap in 6 months.  Loretta Austria, MD, [facility], [facility] 06/08/2020, 8:23 PM

## 2020-06-09 ENCOUNTER — Ambulatory Visit (INDEPENDENT_AMBULATORY_CARE_PROVIDER_SITE_OTHER): Payer: BC Managed Care – PPO | Admitting: Obstetrics and Gynecology

## 2020-06-09 ENCOUNTER — Encounter: Payer: Self-pay | Admitting: Obstetrics and Gynecology

## 2020-06-09 ENCOUNTER — Other Ambulatory Visit: Payer: Self-pay

## 2020-06-09 VITALS — BP 110/72 | Wt 145.0 lb

## 2020-06-09 DIAGNOSIS — Z4889 Encounter for other specified surgical aftercare: Secondary | ICD-10-CM

## 2020-12-28 ENCOUNTER — Other Ambulatory Visit: Payer: Self-pay | Admitting: Obstetrics and Gynecology

## 2021-04-03 ENCOUNTER — Other Ambulatory Visit: Payer: Self-pay

## 2021-04-03 MED ORDER — XULANE 150-35 MCG/24HR TD PTWK: MEDICATED_PATCH | TRANSDERMAL | 0 refills | Status: DC

## 2021-04-03 NOTE — Telephone Encounter (Signed)
Pt calling for refill of bc; has appt 04/24/21.  [phone] pt aware refill eRx'd.

## 2021-04-23 NOTE — Progress Notes (Signed)
PCP:  Patient, No Pcp Per (Inactive)   Chief Complaint  Patient presents with   Gynecologic Exam    No concerns     HPI:      [name] is a 31 y.o. G0P0000 whose LMP was Patient's last menstrual period was 04/08/2021 (exact date)., presents today for her annual examination.  Her menses are regular every 28-30 days, lasting 3 days.  Dysmenorrhea mild, improved with NSAIDs. She does not have intermenstrual bleeding.  Sex activity: not sexually active.  Last Pap: 01/17/20 Results were: high-grade squamous intraepithelial neoplasia  (HGSIL-encompassing moderate and severe dysplasia); s/p LEEP with CIN2-3 with Dr. Bonney Aid 1/22; repeat pap due after 6 months, not done; hx of ASCUS/pos HPV DNA 8/20 with CIN 1 on colpo bx 12/16/18 Hx of STDs: HPV  Hx of BV in past. Feels like sx are recurrent, no sx today. Uses dove sens skin soap, dryer sheets. Wears reg underwear, cotton. Has tried boric acid supp in past with sx relief.   There is a FH of breast cancer in her PGM, pt unsure of age of dx. There is a FH of ovarian cancer in her pat 2nd cousin, genetic testing not indicated for pt. The patient does do self-breast exams.  Tobacco use: smokes black and milds daily Alcohol use: none Occas marijuana use.  Exercise: moderately active  She does get adequate calcium and Vitamin D in her diet.  Patient Active Problem List   Diagnosis Date Noted   CIN III (cervical intraepithelial neoplasia grade III) with severe dysplasia 03/06/2020    Past Surgical History:  Procedure Laterality Date   TONSILLECTOMY      Family History  Problem Relation Age of Onset   Diabetes Mother    Lupus Mother     Social History   Socioeconomic History   Marital status: Single    Spouse name: Not on file   Number of children: Not on file   Years of education: Not on file   Highest education level: Not on file  Occupational History   Not on file  Tobacco Use   Smoking status: Some Days     Types: Cigarettes   Smokeless tobacco: Never   Tobacco comments:    black and milds  Vaping Use   Vaping Use: Never used  Substance and Sexual Activity   Alcohol use: No   Drug use: Yes    Types: Marijuana   Sexual activity: Not Currently    Birth control/protection: Patch  Other Topics Concern   Not on file  Social History Narrative   Not on file   Social Determinants of Health   Financial Resource Strain: Not on file  Food Insecurity: Not on file  Transportation Needs: Not on file  Physical Activity: Not on file  Stress: Not on file  Social Connections: Not on file  Intimate Partner Violence: Not on file     Current Outpatient Medications:    norelgestromin-ethinyl estradiol Burr Medico) 150-35 MCG/24HR transdermal patch, APPLY 1 PATCH EVERY WEEK FOR 3 WEEKS AND 1 WEEK FREE OF PATCH, THEN REPEAT AS DIRECTED, Disp: 9 patch, Rfl: 3     ROS:  Review of Systems  Constitutional:  Positive for fatigue. Negative for fever and unexpected weight change.  Respiratory:  Negative for cough, shortness of breath and wheezing.   Cardiovascular:  Negative for chest pain, palpitations and leg swelling.  Gastrointestinal:  Positive for constipation. Negative for blood in stool, diarrhea, nausea and vomiting.  Endocrine:  Negative for cold intolerance, heat intolerance and polyuria.  Genitourinary:  Negative for dyspareunia, dysuria, flank pain, frequency, genital sores, hematuria, menstrual problem, pelvic pain, urgency, vaginal bleeding, vaginal discharge and vaginal pain.  Musculoskeletal:  Negative for back pain, joint swelling and myalgias.  Skin:  Negative for rash.  Neurological:  Negative for dizziness, syncope, light-headedness, numbness and headaches.  Hematological:  Negative for adenopathy.  Psychiatric/Behavioral:  Positive for agitation and dysphoric mood. Negative for confusion, sleep disturbance and suicidal ideas. The patient is not nervous/anxious.   BREAST: No  symptoms   Objective: BP 100/70    Ht 5\' 5"  (1.651 m)    Wt 134 lb (60.8 kg)    LMP 04/08/2021 (Exact Date)    BMI 22.30 kg/m    Physical Exam Constitutional:      Appearance: She is well-developed.  Genitourinary:     Vulva normal.     Right Labia: No rash, tenderness or lesions.    Left Labia: No tenderness, lesions or rash.    No vaginal discharge, erythema or tenderness.      Right Adnexa: not tender and no mass present.    Left Adnexa: not tender and no mass present.    No cervical friability or polyp.     Uterus is not enlarged or tender.  Breasts:    Right: No mass, nipple discharge, skin change or tenderness.     Left: No mass, nipple discharge, skin change or tenderness.  Neck:     Thyroid: No thyromegaly.  Cardiovascular:     Rate and Rhythm: Normal rate and regular rhythm.     Heart sounds: Normal heart sounds. No murmur heard. Pulmonary:     Effort: Pulmonary effort is normal.     Breath sounds: Normal breath sounds.  Abdominal:     Palpations: Abdomen is soft.     Tenderness: There is no abdominal tenderness. There is no guarding or rebound.  Musculoskeletal:        General: Normal range of motion.     Cervical back: Normal range of motion.  Lymphadenopathy:     Cervical: No cervical adenopathy.  Neurological:     General: No focal deficit present.     Mental Status: She is alert and oriented to person, place, and time.     Cranial Nerves: No cranial nerve deficit.  Skin:    General: Skin is warm and dry.  Psychiatric:        Mood and Affect: Mood normal.        Behavior: Behavior normal.        Thought Content: Thought content normal.        Judgment: Judgment normal.  Vitals reviewed.     Assessment/Plan: Encounter for annual routine gynecological examination  Cervical cancer screening - Plan: Cytology - PAP  Screening for HPV (human papillomavirus) - Plan: Cytology - PAP  CIN III (cervical intraepithelial neoplasia grade III) with severe  dysplasia - Plan: Cytology - PAP; repeat pap today. Will f/u with results.   Encounter for surveillance of transdermal patch hormonal contraceptive device - Plan: norelgestromin-ethinyl estradiol Marilu Favre) 150-35 MCG/24HR transdermal patch; Rx RF.   Family history of breast cancer--pt to clarify age of PGM. May qualify for cancer genetic testing.   Meds ordered this encounter  Medications   norelgestromin-ethinyl estradiol (XULANE) 150-35 MCG/24HR transdermal patch    Sig: APPLY 1 PATCH EVERY WEEK FOR 3 WEEKS AND 1 WEEK FREE OF PATCH, THEN REPEAT AS DIRECTED    Dispense:  9 patch    Refill:  3    Order Specific Question:   Supervising Provider    Answer:   Gae Dry [id number]             GYN counsel adequate intake of calcium and vitamin D, diet and exercise     F/U  Return in about 1 year (around 04/24/2022).  Quasean Frye B. Fariha Goto, PA-C 04/24/2021 10:39 AM

## 2021-04-24 ENCOUNTER — Ambulatory Visit (INDEPENDENT_AMBULATORY_CARE_PROVIDER_SITE_OTHER): Payer: BC Managed Care – PPO | Admitting: Obstetrics and Gynecology

## 2021-04-24 ENCOUNTER — Other Ambulatory Visit (HOSPITAL_COMMUNITY)
Admission: RE | Admit: 2021-04-24 | Discharge: 2021-04-24 | Disposition: A | Discharge status: Home / Self Care | Payer: BC Managed Care – PPO | Source: Ambulatory Visit | Attending: Obstetrics and Gynecology | Admitting: Obstetrics and Gynecology

## 2021-04-24 ENCOUNTER — Other Ambulatory Visit: Payer: Self-pay

## 2021-04-24 ENCOUNTER — Encounter: Payer: Self-pay | Admitting: Obstetrics and Gynecology

## 2021-04-24 VITALS — BP 100/70 | Ht 65.0 in | Wt 134.0 lb

## 2021-04-24 DIAGNOSIS — Z1151 Encounter for screening for human papillomavirus (HPV): Secondary | ICD-10-CM | POA: Insufficient documentation

## 2021-04-24 DIAGNOSIS — Z124 Encounter for screening for malignant neoplasm of cervix: Secondary | ICD-10-CM | POA: Diagnosis not present

## 2021-04-24 DIAGNOSIS — Z803 Family history of malignant neoplasm of breast: Secondary | ICD-10-CM

## 2021-04-24 DIAGNOSIS — Z01419 Encounter for gynecological examination (general) (routine) without abnormal findings: Secondary | ICD-10-CM | POA: Diagnosis not present

## 2021-04-24 DIAGNOSIS — D069 Carcinoma in situ of cervix, unspecified: Secondary | ICD-10-CM

## 2021-04-24 DIAGNOSIS — Z3045 Encounter for surveillance of transdermal patch hormonal contraceptive device: Secondary | ICD-10-CM

## 2021-04-24 MED ORDER — XULANE 150-35 MCG/24HR TD PTWK: MEDICATED_PATCH | TRANSDERMAL | 3 refills | Status: DC

## 2021-04-24 NOTE — Patient Instructions (Signed)
I value your feedback and you entrusting us with your care. If you get a [facility] patient survey, I would appreciate you taking the time to let us know about your experience today. Thank you! ? ? ?

## 2021-04-26 LAB — CYTOLOGY - PAP
Adequacy: ABSENT
Comment: NEGATIVE
Diagnosis: UNDETERMINED — AB
High risk HPV: NEGATIVE

## 2022-03-12 ENCOUNTER — Other Ambulatory Visit: Payer: Self-pay | Admitting: Obstetrics and Gynecology

## 2022-03-12 DIAGNOSIS — Z3045 Encounter for surveillance of transdermal patch hormonal contraceptive device: Secondary | ICD-10-CM

## 2022-06-03 ENCOUNTER — Other Ambulatory Visit: Payer: Self-pay

## 2022-06-03 DIAGNOSIS — Z3045 Encounter for surveillance of transdermal patch hormonal contraceptive device: Secondary | ICD-10-CM

## 2022-06-03 MED ORDER — NORELGESTROMIN-ETH ESTRADIOL 150-35 MCG/24HR TD PTWK: MEDICATED_PATCH | TRANSDERMAL | 0 refills | Status: DC

## 2022-07-11 ENCOUNTER — Encounter: Payer: Self-pay | Admitting: Obstetrics and Gynecology

## 2022-07-11 ENCOUNTER — Ambulatory Visit (INDEPENDENT_AMBULATORY_CARE_PROVIDER_SITE_OTHER): Payer: BC Managed Care – PPO | Admitting: Obstetrics and Gynecology

## 2022-07-11 ENCOUNTER — Other Ambulatory Visit (HOSPITAL_COMMUNITY)
Admission: RE | Admit: 2022-07-11 | Discharge: 2022-07-11 | Disposition: A | Discharge status: Home / Self Care | Payer: BC Managed Care – PPO | Source: Ambulatory Visit | Attending: Obstetrics and Gynecology | Admitting: Obstetrics and Gynecology

## 2022-07-11 VITALS — BP 100/70 | Ht 65.0 in | Wt 136.0 lb

## 2022-07-11 DIAGNOSIS — Z1151 Encounter for screening for human papillomavirus (HPV): Secondary | ICD-10-CM

## 2022-07-11 DIAGNOSIS — D069 Carcinoma in situ of cervix, unspecified: Secondary | ICD-10-CM | POA: Diagnosis not present

## 2022-07-11 DIAGNOSIS — Z01419 Encounter for gynecological examination (general) (routine) without abnormal findings: Secondary | ICD-10-CM | POA: Diagnosis not present

## 2022-07-11 DIAGNOSIS — Z124 Encounter for screening for malignant neoplasm of cervix: Secondary | ICD-10-CM | POA: Diagnosis not present

## 2022-07-11 DIAGNOSIS — Z3045 Encounter for surveillance of transdermal patch hormonal contraceptive device: Secondary | ICD-10-CM

## 2022-07-11 DIAGNOSIS — R093 Abnormal sputum: Secondary | ICD-10-CM

## 2022-07-11 DIAGNOSIS — R053 Chronic cough: Secondary | ICD-10-CM

## 2022-07-11 MED ORDER — NORELGESTROMIN-ETH ESTRADIOL 150-35 MCG/24HR TD PTWK: MEDICATED_PATCH | TRANSDERMAL | 3 refills | Status: DC

## 2022-07-11 NOTE — Patient Instructions (Signed)
I value your feedback and you entrusting us with your care. If you get a [facility] patient survey, I would appreciate you taking the time to let us know about your experience today. Thank you! ? ? ?

## 2022-07-11 NOTE — Progress Notes (Signed)
PCP:  Patient, No Pcp Per   Chief Complaint  Patient presents with   Gynecologic Exam    No concerns     HPI:      [name] is a [age] y.o. G0P0000 whose LMP was Patient's last menstrual period was 06/28/2022 (exact date)., presents today for her annual examination.  Her menses are regular every 28-30 days, lasting 3 days on xulane, mod flow.  Dysmenorrhea mild, improved with NSAIDs. She does not have intermenstrual bleeding.   Sex activity: not currently sexually active--on xulane patch. No vag sx.  Last Pap: 04/24/21 Results were ASCUS/neg HPV DNA. Repeat pap due today.  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 Bonney AidStaebler 1/22; repeat pap due after 6 months, not done hx of ASCUS/pos HPV DNA 8/20 with CIN 1 on colpo bx 12/16/18 Hx of STDs: HPV  Hx of recurrent BV in past; sx improved since stopping tampon use/not sexually active.   There is a FH of breast cancer in her PGM, pt unsure of age of dx. There is a FH of ovarian cancer in her pat 2nd cousin, genetic testing not indicated for pt. The patient does do self-breast exams.  Tobacco use: smokes black and milds daily Alcohol use: none Daily marijuana use.  Exercise: moderately active  She does get adequate calcium but not Vitamin D in her diet.  Pt has noticed brown sputum when has a productive cough. Sometimes like mucous "balls". Sx have been going on "awhile" and may be worse after smoking black and milds. FH lung cancer in her MGM.   Patient Active Problem List   Diagnosis Date Noted   CIN III (cervical intraepithelial neoplasia grade III) with severe dysplasia 03/06/2020    Past Surgical History:  Procedure Laterality Date   TONSILLECTOMY      Family History  Problem Relation Age of Onset   Diabetes Mother    Lupus Mother    Lung cancer Maternal Grandmother        spread all over body    Breast cancer Paternal Grandmother        ?40s/50s   Ovarian cancer Cousin     Social History   Socioeconomic History   Marital status: Single    Spouse name: Not on file   Number of children: Not on file   Years of education: Not on file   Highest education level: Not on file  Occupational History   Not on file  Tobacco Use   Smoking status: Some Days    Types: Cigarettes   Smokeless tobacco: Never   Tobacco comments:    black and milds  Vaping Use   Vaping Use: Never used  Substance and Sexual Activity   Alcohol use: No   Drug use: Yes    Types: Marijuana   Sexual activity: Yes    Birth control/protection: Patch, Condom  Other Topics Concern   Not on file  Social History Narrative   Not on file   Social Determinants of Health   Financial Resource Strain: Not on file  Food Insecurity: Not on file  Transportation Needs: Not on file  Physical Activity: Not on file  Stress: Not on file  Social Connections: Not on file  Intimate Partner Violence: Not on file     Current Outpatient Medications:    norelgestromin-ethinyl estradiol Burr Medico(XULANE) 150-35 MCG/24HR transdermal patch, APPLY 1 PATCH EVERY WEEK FOR 3 WEEKS AND  1 WEEK FREE OF PATCH, THEN REPEAT AS DIRECTED, Disp: 9 patch, Rfl: 3     ROS:  Review of Systems  Constitutional:  Negative for fatigue, fever and unexpected weight change.  Respiratory:  Positive for cough. Negative for shortness of breath and wheezing.   Cardiovascular:  Negative for chest pain, palpitations and leg swelling.  Gastrointestinal:  Negative for blood in stool, constipation, diarrhea, nausea and vomiting.  Endocrine: Negative for cold intolerance, heat intolerance and polyuria.  Genitourinary:  Negative for dyspareunia, dysuria, flank pain, frequency, genital sores, hematuria, menstrual problem, pelvic pain, urgency, vaginal bleeding, vaginal discharge and vaginal pain.  Musculoskeletal:  Negative for back pain, joint swelling and  myalgias.  Skin:  Negative for rash.  Neurological:  Negative for dizziness, syncope, light-headedness, numbness and headaches.  Hematological:  Negative for adenopathy.  Psychiatric/Behavioral:  Negative for agitation, confusion, sleep disturbance and suicidal ideas. The patient is not nervous/anxious.    BREAST: No symptoms   Objective: BP 100/70   Ht 5\' 5"  (1.651 m)   Wt 136 lb (61.7 kg)   LMP 06/28/2022 (Exact Date)   BMI 22.63 kg/m    Physical Exam Constitutional:      Appearance: She is well-developed.  Genitourinary:     Vulva normal.     Right Labia: No rash, tenderness or lesions.    Left Labia: No tenderness, lesions or rash.    No vaginal discharge, erythema or tenderness.      Right Adnexa: not tender and no mass present.    Left Adnexa: not tender and no mass present.    No cervical friability or polyp.     Uterus is not enlarged or tender.  Breasts:    Right: No mass, nipple discharge, skin change or tenderness.     Left: No mass, nipple discharge, skin change or tenderness.  Neck:     Thyroid: No thyromegaly.  Cardiovascular:     Rate and Rhythm: Normal rate and regular rhythm.     Heart sounds: Normal heart sounds. No murmur heard. Pulmonary:     Effort: Pulmonary effort is normal.     Breath sounds: Normal breath sounds.  Abdominal:     Palpations: Abdomen is soft.     Tenderness: There is no abdominal tenderness. There is no guarding or rebound.  Musculoskeletal:        General: Normal range of motion.     Cervical back: Normal range of motion.  Lymphadenopathy:     Cervical: No cervical adenopathy.  Neurological:     General: No focal deficit present.     Mental Status: She is alert and oriented to person, place, and time.     Cranial Nerves: No cranial nerve deficit.  Skin:    General: Skin is warm and dry.  Psychiatric:        Mood and Affect: Mood normal.        Behavior: Behavior normal.        Thought Content: Thought content normal.         Judgment: Judgment normal.  Vitals reviewed.      Assessment/Plan: Encounter for annual routine gynecological examination  Cervical cancer screening - Plan: Cytology - PAP  Screening for HPV (human papillomavirus) - Plan: Cytology - PAP  CIN III (cervical intraepithelial neoplasia grade III) with severe dysplasia - Plan: Cytology - PAP  Encounter for surveillance of transdermal patch hormonal contraceptive device - Plan: norelgestromin-ethinyl estradiol Burr Medico) 150-35 MCG/24HR transdermal patch; Rx RF.  Chronic cough - Plan: DG Chest 2 View; check CXR. D/C black and milds to see if sx improve. If sx persist, will refer to pulmonology.  Abnormal color of sputum - Plan: DG Chest 2 View   Meds ordered this encounter  Medications   norelgestromin-ethinyl estradiol (XULANE) 150-35 MCG/24HR transdermal patch    Sig: APPLY 1 PATCH EVERY WEEK FOR 3 WEEKS AND 1 WEEK FREE OF PATCH, THEN REPEAT AS DIRECTED    Dispense:  9 patch    Refill:  3    Order Specific Question:   Supervising Provider    Answer:   Waymon Budge             GYN counsel adequate intake of calcium and vitamin D, diet and exercise     F/U  Return in about 1 year (around 07/11/2023).  Treyon Wymore B. Jamilet Ambroise, PA-C 07/11/2022 4:32 PM

## 2022-07-17 LAB — CYTOLOGY - PAP
Adequacy: ABSENT
Comment: NEGATIVE
Diagnosis: NEGATIVE
High risk HPV: NEGATIVE

## 2022-08-29 ENCOUNTER — Other Ambulatory Visit: Payer: Self-pay | Admitting: Family Medicine

## 2022-08-29 DIAGNOSIS — N63 Unspecified lump in unspecified breast: Secondary | ICD-10-CM

## 2022-09-03 ENCOUNTER — Ambulatory Visit
Admission: RE | Admit: 2022-09-03 | Discharge: 2022-09-03 | Disposition: A | Discharge status: Home / Self Care | Payer: BC Managed Care – PPO | Source: Ambulatory Visit | Attending: Family Medicine | Admitting: Family Medicine

## 2022-09-03 DIAGNOSIS — N63 Unspecified lump in unspecified breast: Secondary | ICD-10-CM

## 2022-09-03 DIAGNOSIS — N644 Mastodynia: Secondary | ICD-10-CM | POA: Diagnosis not present

## 2022-12-23 ENCOUNTER — Encounter: Payer: Self-pay | Admitting: Obstetrics and Gynecology

## 2023-01-05 DIAGNOSIS — T23212A Burn of second degree of left thumb (nail), initial encounter: Secondary | ICD-10-CM | POA: Diagnosis not present

## 2023-01-05 DIAGNOSIS — T23272A Burn of second degree of left wrist, initial encounter: Secondary | ICD-10-CM | POA: Diagnosis not present

## 2023-02-04 ENCOUNTER — Ambulatory Visit: Payer: BC Managed Care – PPO | Admitting: Gastroenterology

## 2023-02-04 ENCOUNTER — Encounter: Payer: Self-pay | Admitting: Gastroenterology

## 2023-02-04 VITALS — BP 116/73 | HR 93 | Temp 98.8°F | Ht 65.0 in | Wt 130.0 lb

## 2023-02-04 DIAGNOSIS — K5909 Other constipation: Secondary | ICD-10-CM

## 2023-02-04 NOTE — Progress Notes (Signed)
Loretta Repress, MD [address]  [address]  [place], [place] [address]  Main: [phone]  Fax: [phone]    Gastroenterology Consultation  Referring Provider:     No ref. provider found Primary Care Physician:  Patient, No Pcp Per Primary Gastroenterologist:  Dr. Arlyss Warner Reason for Consultation: Irregular bowel movements        HPI:   Loretta Warner is a 32 y.o. female referred by Patient, No Pcp Per  for consultation & management of chronic constipation.  Patient reports that she has suffered from irregular bowel movements since her childhood.  She used to hold from urinating all day long when she was at school throughout her childhood.  She was not using bathroom at all.  This has Become a Habit continued in her adult life, lead to constipation.  She reports constant pressure in her pelvis, particularly when she does not have a bowel movement for more than a day, associated with significant straining.  She reports trying several over-the-counter stool softeners, fiber supplements with not much benefit.  She admits to drinking juices more than water.  She is not a healthy eater as well She does smoke marijuana and cigarettes She states she smokes marijuana to relieve anxiety  NSAIDs: None  Antiplts/Anticoagulants/Anti thrombotics: None  GI Procedures: None  Past Medical History:  Diagnosis Date   No pertinent past medical history     Past Surgical History:  Procedure Laterality Date   TONSILLECTOMY       Current Outpatient Medications:    norelgestromin-ethinyl estradiol (XULANE) 150-35 MCG/24HR transdermal patch, APPLY 1 PATCH EVERY WEEK FOR 3 WEEKS AND 1 WEEK FREE OF PATCH, THEN REPEAT AS DIRECTED, Disp: 9 patch, Rfl: 3   Family History  Problem Relation Age of Onset   Diabetes Mother    Lupus Mother    Lung cancer Maternal Grandmother        spread all over body   Breast cancer Paternal Grandmother        ?40s/50s   Ovarian cancer  Cousin      Social History   Tobacco Use   Smoking status: Every Day    Current packs/day: 0.25    Types: Cigarettes   Smokeless tobacco: Never   Tobacco comments:    black and milds  Vaping Use   Vaping status: Never Used  Substance Use Topics   Alcohol use: Yes    Comment: social   Drug use: Yes    Types: Marijuana    Allergies as of 02/04/2023 - Review Complete 02/04/2023  Allergen Reaction Noted   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   Physical Exam:  BP 116/73 (BP Location: Right Arm, Patient Position: Sitting, Cuff Size: Normal)   Pulse 93   Temp 98.8 F (37.1 C) (Oral)   Ht 5\' 5"  (1.651 m)   Wt 130 lb (59 kg)   BMI 21.63 kg/m  No LMP recorded.  General:   Alert,  Well-developed, well-nourished, pleasant and cooperative in NAD Head:  Normocephalic and atraumatic. Eyes:  Sclera clear, no icterus.   Conjunctiva pink. Ears:  Normal auditory acuity. Nose:  No deformity, discharge, or lesions. Mouth:  No deformity or lesions,oropharynx pink & moist. Neck:  Supple; no masses or thyromegaly. Lungs:  Respirations even and unlabored.  Clear throughout to auscultation.   No wheezes, crackles, or rhonchi. No  acute distress. Heart:  Regular rate and rhythm; no murmurs, clicks, rubs, or gallops. Abdomen:  Normal bowel sounds. Soft, non-tender and non-distended without masses, hepatosplenomegaly or hernias noted.  No guarding or rebound tenderness.   Rectal: Small perianal skin tag, nontender digital rectal exam Msk:  Symmetrical without gross deformities. Good, equal movement & strength bilaterally. Pulses:  Normal pulses noted. Extremities:  No clubbing or edema.  No cyanosis. Neurologic:  Alert and oriented x3;  grossly normal neurologically. Psych:  Alert and cooperative. Normal mood and affect.  Imaging Studies: None  Assessment and Plan:   [name] is a 32  y.o. female with chronic constipation  Chronic constipation Discussed about high-fiber diet, information provided Advised to have adequate intake of water Trial of Linzess 145 mcg daily, samples provided   Follow up as needed, contact via MyChart if Linzess is working   Loretta Repress, MD

## 2023-02-04 NOTE — Patient Instructions (Signed)
Gave Linzess 145 samples please let us know how it works for you and we can call you in a prescription for you. Take 1 tablet by mouth 30 minutes before breakfast.

## 2023-02-10 ENCOUNTER — Telehealth: Payer: Self-pay

## 2023-02-10 NOTE — Telephone Encounter (Signed)
Pt was given samples of Linzess . Pt has been taking this every morning as directed. 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 She would like to know if she can switch the time of day to the afternoon after work. Also, during her visit Dr. Allegra Lai noted a skin tag around her anal area. She stated with all the diarrhea, the tag is bleeding. She would like some advice on what do with that.

## 2023-02-10 NOTE — Telephone Encounter (Signed)
Patient verbalized understanding of instructions. She will come pick up samples of Linzess 72 tomorrow

## 2023-02-10 NOTE — Telephone Encounter (Signed)
Switch from Linzess 145 mcg to 72 mcg daily.  She can try samples first.  With regards to symptomatic perianal skin tag, recommend witch hazel or Tucks pads or Preparation H.  She can also do sitz bath's.  As her diarrhea resolves, her symptoms should get better with above remedies  RV

## 2023-05-07 ENCOUNTER — Telehealth: Payer: Self-pay | Admitting: Gastroenterology

## 2023-05-07 MED ORDER — LINACLOTIDE 145 MCG PO CAPS: 145.0000 ug | ORAL_CAPSULE | Freq: Every day | ORAL | 2 refills | Status: AC

## 2023-05-07 NOTE — Addendum Note (Signed)
 Addended by: Conny Del L on: 05/07/2023 10:29 AM   Modules accepted: Orders

## 2023-05-07 NOTE — Telephone Encounter (Signed)
 Last office visit 02/04/2023 chronic constipation. Was given Linzess  145 samples  On 02/10/2023 states she is having diarrhea with the 145. So we gave her samples of the Linzess  72mcg. Have never called in a prescription for her. Can we send one to the pharmacy?

## 2023-05-07 NOTE — Telephone Encounter (Signed)
 The patient called in and left a voicemail requesting to get a prescription for the Linzess  72 MG.  I called her back to let her know that we received her message, and her pharmacy [facility] on [address] [place], [place] [address].

## 2023-05-07 NOTE — Telephone Encounter (Signed)
 Sent medication to the pharmacy

## 2023-07-01 DIAGNOSIS — Z0189 Encounter for other specified special examinations: Secondary | ICD-10-CM | POA: Diagnosis not present

## 2023-07-11 ENCOUNTER — Other Ambulatory Visit: Payer: Self-pay | Admitting: Obstetrics and Gynecology

## 2023-07-11 DIAGNOSIS — Z3045 Encounter for surveillance of transdermal patch hormonal contraceptive device: Secondary | ICD-10-CM

## 2023-07-14 MED ORDER — NORELGESTROMIN-ETH ESTRADIOL 150-35 MCG/24HR TD PTWK
MEDICATED_PATCH | TRANSDERMAL | 0 refills | Status: DC
Start: 2023-07-14 — End: 2023-07-25

## 2023-07-14 NOTE — Telephone Encounter (Signed)
 TRIAGE VOICEMAIL: Patient states she has scheduled annual for 4/25.

## 2023-07-14 NOTE — Telephone Encounter (Signed)
 Loretta Warner

## 2023-07-14 NOTE — Telephone Encounter (Signed)
 Left voicemail advising patient appointment must be scheduled before refill can be authorized.

## 2023-07-14 NOTE — Telephone Encounter (Addendum)
Refill sent. Patient aware.  

## 2023-07-14 NOTE — Addendum Note (Signed)
 Addended by: Arcelia Bean on: 07/14/2023 04:06 PM   Modules accepted: Orders

## 2023-07-24 NOTE — Progress Notes (Unsigned)
 PCP:  Patient, No Pcp Per   No chief complaint on file.    HPI:      Loretta Warner is a 33 y.o. G0P0000 whose LMP was No LMP recorded., presents today for her annual examination.  Her menses are regular every 28-30 days, lasting 3 days on xulane , mod flow.  Dysmenorrhea mild, improved with NSAIDs. She does not have intermenstrual bleeding.   Sex activity: not currently sexually active--on xulane  patch. No vag sx.  Last Pap: 07/11/22 Results were NILM, neg HPV DNA.  04/24/21 Results were ASCUS/neg HPV DNA.  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 Clemetine Cypher 1/22; repeat pap due after 6 months, not done hx of ASCUS/pos HPV DNA 8/20 with CIN 1 on colpo bx 12/16/18 Hx of STDs: HPV  Hx of recurrent BV in past; sx improved since stopping tampon use/not sexually active.   There is a FH of breast cancer in her PGM, pt unsure of age of dx. There is a FH of ovarian cancer in her pat 2nd cousin, genetic testing not indicated for pt. The patient does do self-breast exams.  Tobacco use: smokes black and milds daily Alcohol use: none Daily marijuana use.  Exercise: moderately active  She does get adequate calcium but not Vitamin D in her diet.  Pt has noticed brown sputum when has a productive cough. Sometimes like mucous "balls". Sx have been going on "awhile" and may be worse after smoking black and milds. FH lung cancer in her MGM.   Patient Active Problem List   Diagnosis Date Noted   CIN III (cervical intraepithelial neoplasia grade III) with severe dysplasia 03/06/2020    Past Surgical History:  Procedure Laterality Date   TONSILLECTOMY      Family History  Problem Relation Age of Onset   Diabetes Mother    Lupus Mother    Lung cancer Maternal Grandmother        spread all over body   Breast cancer Paternal Grandmother        ?40s/50s   Ovarian cancer  Cousin     Social History   Socioeconomic History   Marital status: Single    Spouse name: Not on file   Number of children: Not on file   Years of education: Not on file   Highest education level: Not on file  Occupational History   Not on file  Tobacco Use   Smoking status: Every Day    Current packs/day: 0.25    Types: Cigarettes   Smokeless tobacco: Never   Tobacco comments:    black and milds  Vaping Use   Vaping status: Never Used  Substance and Sexual Activity   Alcohol use: Yes    Comment: social   Drug use: Yes    Types: Marijuana   Sexual activity: Yes    Birth control/protection: Patch, Condom  Other Topics Concern   Not on file  Social History Narrative   Not on file   Social Drivers of Health   Financial Resource Strain: Not on file  Food Insecurity: Not on file  Transportation Needs: Not on file  Physical Activity: Not on file  Stress: Not on file  Social Connections: Not on file  Intimate Partner Violence: Not on file     Current Outpatient Medications:    linaclotide  (LINZESS ) 145 MCG CAPS capsule, Take 1 capsule (145 mcg total) by mouth daily before breakfast.,  Disp: 30 capsule, Rfl: 2   norelgestromin -ethinyl estradiol  (XULANE ) 150-35 MCG/24HR transdermal patch, APPLY 1 PATCH EVERY WEEK FOR 3 WEEKS AND 1 WEEK FREE OF PATCH, THEN REPEAT AS DIRECTED, Disp: 3 patch, Rfl: 0     ROS:  Review of Systems  Constitutional:  Negative for fatigue, fever and unexpected weight change.  Respiratory:  Positive for cough. Negative for shortness of breath and wheezing.   Cardiovascular:  Negative for chest pain, palpitations and leg swelling.  Gastrointestinal:  Negative for blood in stool, constipation, diarrhea, nausea and vomiting.  Endocrine: Negative for cold intolerance, heat intolerance and polyuria.  Genitourinary:  Negative for dyspareunia, dysuria, flank pain, frequency, genital sores, hematuria, menstrual problem, pelvic pain, urgency, vaginal  bleeding, vaginal discharge and vaginal pain.  Musculoskeletal:  Negative for back pain, joint swelling and myalgias.  Skin:  Negative for rash.  Neurological:  Negative for dizziness, syncope, light-headedness, numbness and headaches.  Hematological:  Negative for adenopathy.  Psychiatric/Behavioral:  Negative for agitation, confusion, sleep disturbance and suicidal ideas. The patient is not nervous/anxious.    BREAST: No symptoms   Objective: There were no vitals taken for this visit.   Physical Exam Constitutional:      Appearance: She is well-developed.  Genitourinary:     Vulva normal.     Right Labia: No rash, tenderness or lesions.    Left Labia: No tenderness, lesions or rash.    No vaginal discharge, erythema or tenderness.      Right Adnexa: not tender and no mass present.    Left Adnexa: not tender and no mass present.    No cervical friability or polyp.     Uterus is not enlarged or tender.  Breasts:    Right: No mass, nipple discharge, skin change or tenderness.     Left: No mass, nipple discharge, skin change or tenderness.  Neck:     Thyroid: No thyromegaly.  Cardiovascular:     Rate and Rhythm: Normal rate and regular rhythm.     Heart sounds: Normal heart sounds. No murmur heard. Pulmonary:     Effort: Pulmonary effort is normal.     Breath sounds: Normal breath sounds.  Abdominal:     Palpations: Abdomen is soft.     Tenderness: There is no abdominal tenderness. There is no guarding or rebound.  Musculoskeletal:        General: Normal range of motion.     Cervical back: Normal range of motion.  Lymphadenopathy:     Cervical: No cervical adenopathy.  Neurological:     General: No focal deficit present.     Mental Status: She is alert and oriented to person, place, and time.     Cranial Nerves: No cranial nerve deficit.  Skin:    General: Skin is warm and dry.  Psychiatric:        Mood and Affect: Mood normal.        Behavior: Behavior normal.         Thought Content: Thought content normal.        Judgment: Judgment normal.  Vitals reviewed.      Assessment/Plan: Encounter for annual routine gynecological examination  Cervical cancer screening - Plan: Cytology - PAP  Screening for HPV (human papillomavirus) - Plan: Cytology - PAP  CIN III (cervical intraepithelial neoplasia grade III) with severe dysplasia - Plan: Cytology - PAP  Encounter for surveillance of transdermal patch hormonal contraceptive device - Plan: norelgestromin -ethinyl estradiol  (XULANE ) 150-35 MCG/24HR transdermal patch; Rx  RF.   Chronic cough - Plan: DG Chest 2 View; check CXR. D/C black and milds to see if sx improve. If sx persist, will refer to pulmonology.  Abnormal color of sputum - Plan: DG Chest 2 View   No orders of the defined types were placed in this encounter.            GYN counsel adequate intake of calcium and vitamin D, diet and exercise     F/U  No follow-ups on file.  Denitra Donaghey B. Alyanna Stoermer, PA-C 07/24/2023 3:35 PM

## 2023-07-25 ENCOUNTER — Encounter: Payer: Self-pay | Admitting: Obstetrics and Gynecology

## 2023-07-25 ENCOUNTER — Other Ambulatory Visit (HOSPITAL_COMMUNITY)
Admission: RE | Admit: 2023-07-25 | Discharge: 2023-07-25 | Disposition: A | Discharge status: Home / Self Care | Source: Ambulatory Visit | Attending: Obstetrics and Gynecology | Admitting: Obstetrics and Gynecology

## 2023-07-25 ENCOUNTER — Ambulatory Visit: Admitting: Obstetrics and Gynecology

## 2023-07-25 VITALS — BP 104/60 | Ht 65.0 in | Wt 128.0 lb

## 2023-07-25 DIAGNOSIS — Z1151 Encounter for screening for human papillomavirus (HPV): Secondary | ICD-10-CM | POA: Diagnosis not present

## 2023-07-25 DIAGNOSIS — Z3045 Encounter for surveillance of transdermal patch hormonal contraceptive device: Secondary | ICD-10-CM

## 2023-07-25 DIAGNOSIS — Z124 Encounter for screening for malignant neoplasm of cervix: Secondary | ICD-10-CM | POA: Insufficient documentation

## 2023-07-25 DIAGNOSIS — D069 Carcinoma in situ of cervix, unspecified: Secondary | ICD-10-CM | POA: Diagnosis not present

## 2023-07-25 DIAGNOSIS — Z01419 Encounter for gynecological examination (general) (routine) without abnormal findings: Secondary | ICD-10-CM

## 2023-07-25 DIAGNOSIS — R3911 Hesitancy of micturition: Secondary | ICD-10-CM | POA: Diagnosis not present

## 2023-07-25 LAB — POCT URINALYSIS DIPSTICK
Bilirubin, UA: NEGATIVE
Blood, UA: NEGATIVE
Glucose, UA: NEGATIVE
Ketones, UA: NEGATIVE
Leukocytes, UA: NEGATIVE
Nitrite, UA: NEGATIVE
Protein, UA: NEGATIVE
Spec Grav, UA: 1.015 (ref 1.010–1.025)
pH, UA: 5 (ref 5.0–8.0)

## 2023-07-25 MED ORDER — NORELGESTROMIN-ETH ESTRADIOL 150-35 MCG/24HR TD PTWK: MEDICATED_PATCH | TRANSDERMAL | 3 refills | Status: DC

## 2023-07-25 NOTE — Patient Instructions (Signed)
 I value your feedback and you entrusting [place] with your care. If you get a [place] patient survey, I would appreciate you taking the time to let us know about your experience today. Thank you! ? ? ?

## 2023-07-30 LAB — CYTOLOGY - PAP
Adequacy: ABSENT
Comment: NEGATIVE
Diagnosis: NEGATIVE
High risk HPV: NEGATIVE

## 2023-08-08 ENCOUNTER — Other Ambulatory Visit: Payer: Self-pay

## 2023-08-08 DIAGNOSIS — Z3045 Encounter for surveillance of transdermal patch hormonal contraceptive device: Secondary | ICD-10-CM

## 2023-08-08 MED ORDER — NORELGESTROMIN-ETH ESTRADIOL 150-35 MCG/24HR TD PTWK: MEDICATED_PATCH | TRANSDERMAL | 3 refills | Status: DC

## 2023-08-09 ENCOUNTER — Other Ambulatory Visit: Payer: Self-pay | Admitting: Advanced Practice Midwife

## 2023-08-09 DIAGNOSIS — Z3045 Encounter for surveillance of transdermal patch hormonal contraceptive device: Secondary | ICD-10-CM

## 2023-08-09 MED ORDER — NORELGESTROMIN-ETH ESTRADIOL 150-35 MCG/24HR TD PTWK: 1.0000 | MEDICATED_PATCH | TRANSDERMAL | 12 refills | Status: AC

## 2023-08-09 NOTE — Progress Notes (Signed)
 Re sent Rx for brand name xulane . Per patient recent Rx does not stick to her skin.

## 2023-08-12 NOTE — Telephone Encounter (Signed)
 Spoke with pharmacy. Pharmacist has made an annotation for Brand Name Only. Rx is out of stock. They have ordered. It should be in tomorrow.

## 2023-08-21 DIAGNOSIS — Z0189 Encounter for other specified special examinations: Secondary | ICD-10-CM | POA: Diagnosis not present
